# Patient Record
Sex: Male | Born: 1965 | Hispanic: No | Smoking: Never smoker
Health system: Southern US, Community
[De-identification: ages and names within clinical notes are randomized; demographics above are authoritative.]

## PROBLEM LIST (undated history)

## (undated) DIAGNOSIS — I10 Essential (primary) hypertension: Secondary | ICD-10-CM

## (undated) DIAGNOSIS — I639 Cerebral infarction, unspecified: Secondary | ICD-10-CM

## (undated) HISTORY — DX: Essential (primary) hypertension: I10

---

## 2006-05-27 ENCOUNTER — Inpatient Hospital Stay (HOSPITAL_COMMUNITY): Admission: EM | Admit: 2006-05-27 | Discharge: 2006-05-29 | Payer: Self-pay | Admitting: Emergency Medicine

## 2010-05-16 NOTE — Op Note (Signed)
NAME:  [name], [place] NO.:  [id number]   MEDICAL RECORD NO.:  [id number]          PATIENT TYPE:  INP   LOCATION:  2550                         FACILITY:  [facility]   PHYSICIAN:  Dionne Ano. Gramig III, M.D.DATE OF BIRTH:  [birth year]   DATE OF PROCEDURE:  05/27/2006  DATE OF DISCHARGE:                               OPERATIVE REPORT   PREOPERATIVE DIAGNOSES:  1. Chain saw injury to the left hand with multiple tendon injuries.  2. Comminuted segmental index metacarpal fracture.  3. Soft tissue disarray.   POSTOPERATIVE DIAGNOSES:  1. Chain saw injury to the left hand with multiple tendon injuries.  2. Comminuted segmental index metacarpal fracture.  3. Soft tissue disarray.   PROCEDURES:  1. I&D of skin, subcutaneous tissue, bone, tendon and muscle tissue      which was an excisional debridement of an open fracture, left hand.  2. Open reduction and internal fixation with extended plate and screw      construct, left index finger, metacarpal fracture.  3. Extensor pollicis longus tendon repair, left hand.  4. Extensor pollicis brevis tendon repair, left hand.  5. Superficial radial nerve burying procedure, left hand.  6. Extensor indicis proprius repair, left hand.  7. Extensor digitorum communis tendon repair to the left index finger.  8. Extensor digitorum communis repair to the middle finger.  9. First dorsal interosseous musculotendinous junction reattachment      and repair.  10.Stress radiography.   SURGEON:  Dominica Severin, M.D.   ASSISTANT:  None.   COMPLICATIONS:  None.   ANESTHESIA:  General.   INDICATIONS FOR PROCEDURE:  This patient is a 45 year old male who  presents with the above-mentioned diagnoses.  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  I was called  to see him in the emergency room, and immediately prepped him for  surgery.  This patient has significant soft tissue disarray and  significant  abnormality in regard to his index finger metacarpal  fracture which has a crushing injury with segmental comminution.  The  extensor tendons were also lacerated, and the deep muscular regions are  highly involved in the injury process.   I have counseled him in regard to the risks and benefits of the surgery  including risk of infection, bleeding, anesthesia, damage to normal  structures, and failure of surgery to accomplish its intended goals of  relieving symptoms and restoring function.  With this in mind, he  desires to proceed.  All questions had been encouraged and answered  preoperatively.   OPERATIVE FINDINGS:  This patient has significant soft tissue disarray,  opened segmental index finger metacarpal fracture which was stabilized  nicely and multiple tendon repairs as noted.  There were no complicating  features with this surgery.  I resected any nonviable tissue and  performed an aggressive debridement.   DESCRIPTION OF PROCEDURE:  The patient was seen by me and taken to the  operative suite, and underwent smooth induction of general anesthesia.  He was marked and consented, and all questions were encouraged and  answered preoperatively.  Once in the operative suite, he underwent a  thorough prep and drape with Betadine scrub and paint, and the operation  commenced with elevation of the tourniquet given his significant  bleeding.   I immediately performed cauterization and tying off venous bleeders.  Following this, I performed a wide excisional debridement of skin,  subcutaneous tissue, muscle and nonviable tissue.  Greater than 3-4  liters of saline were placed aggressively through the wound completing  the I&D.   Following this, I then performed segmental repair of the index finger  metacarpal fracture.  A 2.4 mm Synthes modular hand plate and multiple  interfrag screws were placed.  I was able to achieve good rotational  alignment, and restore the length nicely.   This was very comminuted I  should note.   Following this, I then performed stress radiography, and permanently  documented the ORIF which was judged by myself intraoperatively and  looked excellent.   Following this, I then prepared and repaired the extensor pollicis  brevis tendon at the hand level with a four strand FiberWire suture.   Following this, I repaired the extensor pollicis longus at the hand  level with a four strand FiberWire construct.  This was a 3-0 FiberWire.   The superficial radial nerve had segmental injury, and I thus buried it.  I did not see any meaningful repair efforts in this region.   Following this, I then performed extensor indicis proprius tendon repair  with a four strand FiberWire suture of the 3-0 variety.  The tendon ends  were freshened and sutured together nicely.   Following this, I then performed extensor indicis communis to the index  finger tendon repair with four strand 3-0 FiberWire.  The patient  tolerated this well.   Following this, I then performed extensor digitorum communis tendon  repair to the middle finger with a two strand FiberWire construct of the  3-0 variety.  Following this, I then irrigated copiously once again.  I  should note that the tendon repairs were performed with the tourniquet  deflated, and tourniquet time was approximately an hour.  Hemostasis was  obtained nicely.   Following this, I repaired the first dorsal interosseous  musculotendinous juncture to try to give the patient back some  restoration of his index finger interosseous function about the first  web space.   Following this, I then performed a slide advancement of skin edges.  Once this was done, 4-0 Prolene was used to secure the skin edges with a  combination far near and near far and horizontal mattress suture to  evert the skin.  The patient tolerated this well, and had excellent refill and soft compartments, and was placed in a thumb spica  splint  extended in a nature to the thumb tip of course and to the DIP joints of  the index and small fingers.   The patient tolerated the procedure well.  He was taken to the recovery  in stable condition to be continued on IV antibiotics, general postop  observation and other measures.   I discussed the pertinent do's and don'ts, etc., and all questions have  been encouraged and answered.           ______________________________  Dionne Ano. Everlene Other, M.D.     Nash Mantis  D:  05/27/2006  T:  05/28/2006  Job:  [id number]

## 2016-09-24 ENCOUNTER — Emergency Department (HOSPITAL_COMMUNITY): Payer: BLUE CROSS/BLUE SHIELD

## 2016-09-24 ENCOUNTER — Encounter (HOSPITAL_COMMUNITY): Payer: Self-pay

## 2016-09-24 ENCOUNTER — Inpatient Hospital Stay (HOSPITAL_COMMUNITY)
Admission: EM | Admit: 2016-09-24 | Discharge: 2016-10-07 | DRG: 023 | Disposition: A | Discharge status: Hospice | Payer: BLUE CROSS/BLUE SHIELD | Attending: Neurology | Admitting: Neurology

## 2016-09-24 DIAGNOSIS — R4701 Aphasia: Secondary | ICD-10-CM | POA: Diagnosis present

## 2016-09-24 DIAGNOSIS — G919 Hydrocephalus, unspecified: Secondary | ICD-10-CM

## 2016-09-24 DIAGNOSIS — I959 Hypotension, unspecified: Secondary | ICD-10-CM | POA: Diagnosis not present

## 2016-09-24 DIAGNOSIS — G911 Obstructive hydrocephalus: Secondary | ICD-10-CM | POA: Diagnosis present

## 2016-09-24 DIAGNOSIS — Z6832 Body mass index (BMI) 32.0-32.9, adult: Secondary | ICD-10-CM | POA: Diagnosis not present

## 2016-09-24 DIAGNOSIS — G935 Compression of brain: Secondary | ICD-10-CM

## 2016-09-24 DIAGNOSIS — R29810 Facial weakness: Secondary | ICD-10-CM | POA: Diagnosis present

## 2016-09-24 DIAGNOSIS — J189 Pneumonia, unspecified organism: Secondary | ICD-10-CM

## 2016-09-24 DIAGNOSIS — F1010 Alcohol abuse, uncomplicated: Secondary | ICD-10-CM | POA: Diagnosis not present

## 2016-09-24 DIAGNOSIS — E669 Obesity, unspecified: Secondary | ICD-10-CM | POA: Diagnosis present

## 2016-09-24 DIAGNOSIS — Z515 Encounter for palliative care: Secondary | ICD-10-CM | POA: Diagnosis not present

## 2016-09-24 DIAGNOSIS — R40243 Glasgow coma scale score 3-8, unspecified time: Secondary | ICD-10-CM | POA: Diagnosis present

## 2016-09-24 DIAGNOSIS — Z713 Dietary counseling and surveillance: Secondary | ICD-10-CM | POA: Diagnosis not present

## 2016-09-24 DIAGNOSIS — E876 Hypokalemia: Secondary | ICD-10-CM

## 2016-09-24 DIAGNOSIS — R069 Unspecified abnormalities of breathing: Secondary | ICD-10-CM

## 2016-09-24 DIAGNOSIS — Z452 Encounter for adjustment and management of vascular access device: Secondary | ICD-10-CM | POA: Diagnosis not present

## 2016-09-24 DIAGNOSIS — G8191 Hemiplegia, unspecified affecting right dominant side: Secondary | ICD-10-CM | POA: Diagnosis present

## 2016-09-24 DIAGNOSIS — I611 Nontraumatic intracerebral hemorrhage in hemisphere, cortical: Secondary | ICD-10-CM | POA: Diagnosis not present

## 2016-09-24 DIAGNOSIS — R414 Neurologic neglect syndrome: Secondary | ICD-10-CM | POA: Diagnosis present

## 2016-09-24 DIAGNOSIS — R739 Hyperglycemia, unspecified: Secondary | ICD-10-CM | POA: Diagnosis present

## 2016-09-24 DIAGNOSIS — I615 Nontraumatic intracerebral hemorrhage, intraventricular: Secondary | ICD-10-CM | POA: Diagnosis present

## 2016-09-24 DIAGNOSIS — I5030 Unspecified diastolic (congestive) heart failure: Secondary | ICD-10-CM | POA: Diagnosis not present

## 2016-09-24 DIAGNOSIS — E785 Hyperlipidemia, unspecified: Secondary | ICD-10-CM | POA: Diagnosis not present

## 2016-09-24 DIAGNOSIS — Z66 Do not resuscitate: Secondary | ICD-10-CM | POA: Diagnosis not present

## 2016-09-24 DIAGNOSIS — K5901 Slow transit constipation: Secondary | ICD-10-CM

## 2016-09-24 DIAGNOSIS — R4182 Altered mental status, unspecified: Secondary | ICD-10-CM | POA: Diagnosis not present

## 2016-09-24 DIAGNOSIS — Z4659 Encounter for fitting and adjustment of other gastrointestinal appliance and device: Secondary | ICD-10-CM

## 2016-09-24 DIAGNOSIS — I619 Nontraumatic intracerebral hemorrhage, unspecified: Secondary | ICD-10-CM | POA: Diagnosis present

## 2016-09-24 DIAGNOSIS — I610 Nontraumatic intracerebral hemorrhage in hemisphere, subcortical: Secondary | ICD-10-CM | POA: Diagnosis not present

## 2016-09-24 DIAGNOSIS — I10 Essential (primary) hypertension: Secondary | ICD-10-CM

## 2016-09-24 DIAGNOSIS — D72829 Elevated white blood cell count, unspecified: Secondary | ICD-10-CM | POA: Diagnosis present

## 2016-09-24 DIAGNOSIS — R1310 Dysphagia, unspecified: Secondary | ICD-10-CM | POA: Diagnosis present

## 2016-09-24 DIAGNOSIS — R29724 NIHSS score 24: Secondary | ICD-10-CM | POA: Diagnosis present

## 2016-09-24 DIAGNOSIS — G936 Cerebral edema: Secondary | ICD-10-CM

## 2016-09-24 DIAGNOSIS — I161 Hypertensive emergency: Secondary | ICD-10-CM | POA: Diagnosis not present

## 2016-09-24 DIAGNOSIS — R1312 Dysphagia, oropharyngeal phase: Secondary | ICD-10-CM | POA: Diagnosis not present

## 2016-09-24 DIAGNOSIS — I612 Nontraumatic intracerebral hemorrhage in hemisphere, unspecified: Secondary | ICD-10-CM | POA: Diagnosis not present

## 2016-09-24 HISTORY — DX: Cerebral infarction, unspecified: I63.9

## 2016-09-24 LAB — CBC
HCT: 46.7 % (ref 39.0–52.0)
Hemoglobin: 16.3 g/dL (ref 13.0–17.0)
MCH: 33.1 pg (ref 26.0–34.0)
MCHC: 34.9 g/dL (ref 30.0–36.0)
MCV: 94.7 fL (ref 78.0–100.0)
PLATELETS: 187 10*3/uL (ref 150–400)
RBC: 4.93 MIL/uL (ref 4.22–5.81)
RDW: 12.2 % (ref 11.5–15.5)
WBC: 9.8 10*3/uL (ref 4.0–10.5)

## 2016-09-24 LAB — COMPREHENSIVE METABOLIC PANEL WITH GFR
ALT: 111 U/L — ABNORMAL HIGH (ref 17–63)
AST: 53 U/L — ABNORMAL HIGH (ref 15–41)
Albumin: 3.8 g/dL (ref 3.5–5.0)
Alkaline Phosphatase: 77 U/L (ref 38–126)
Anion gap: 9 (ref 5–15)
BUN: 10 mg/dL (ref 6–20)
CO2: 26 mmol/L (ref 22–32)
Calcium: 8.8 mg/dL — ABNORMAL LOW (ref 8.9–10.3)
Chloride: 101 mmol/L (ref 101–111)
Creatinine, Ser: 0.88 mg/dL (ref 0.61–1.24)
GFR calc Af Amer: >60 mL/min
GFR calc non Af Amer: >60 mL/min
Glucose, Bld: 124 mg/dL — ABNORMAL HIGH (ref 65–99)
Potassium: 3.4 mmol/L — ABNORMAL LOW (ref 3.5–5.1)
Sodium: 136 mmol/L (ref 135–145)
Total Bilirubin: 1.2 mg/dL (ref 0.3–1.2)
Total Protein: 6.5 g/dL (ref 6.5–8.1)

## 2016-09-24 LAB — DIFFERENTIAL
Basophils Absolute: 0 K/uL (ref 0.0–0.1)
Basophils Relative: 0 %
Eosinophils Absolute: 0.2 K/uL (ref 0.0–0.7)
Eosinophils Relative: 2 %
Lymphocytes Relative: 33 %
Lymphs Abs: 3.2 K/uL (ref 0.7–4.0)
Monocytes Absolute: 0.9 K/uL (ref 0.1–1.0)
Monocytes Relative: 9 %
Neutro Abs: 5.5 K/uL (ref 1.7–7.7)
Neutrophils Relative %: 56 %

## 2016-09-24 LAB — URINALYSIS, ROUTINE W REFLEX MICROSCOPIC
Bacteria, UA: NONE SEEN
Bilirubin Urine: NEGATIVE
Glucose, UA: 150 mg/dL — AB
Ketones, ur: NEGATIVE mg/dL
Leukocytes, UA: NEGATIVE
Nitrite: NEGATIVE
Protein, ur: NEGATIVE mg/dL
RBC / HPF: NONE SEEN RBC/hpf (ref 0–5)
Specific Gravity, Urine: 1.026 (ref 1.005–1.030)
Squamous Epithelial / HPF: NONE SEEN
pH: 6 (ref 5.0–8.0)

## 2016-09-24 LAB — I-STAT CHEM 8, ED
BUN: 11 mg/dL (ref 6–20)
CREATININE: 0.9 mg/dL (ref 0.61–1.24)
Calcium, Ion: 1.03 mmol/L — ABNORMAL LOW (ref 1.15–1.40)
Chloride: 98 mmol/L — ABNORMAL LOW (ref 101–111)
Glucose, Bld: 124 mg/dL — ABNORMAL HIGH (ref 65–99)
HEMATOCRIT: 49 % (ref 39.0–52.0)
HEMOGLOBIN: 16.7 g/dL (ref 13.0–17.0)
POTASSIUM: 3.4 mmol/L — AB (ref 3.5–5.1)
Sodium: 137 mmol/L (ref 135–145)
TCO2: 26 mmol/L (ref 22–32)

## 2016-09-24 LAB — PROTIME-INR
INR: 1.09
Prothrombin Time: 14 seconds (ref 11.4–15.2)

## 2016-09-24 LAB — ETHANOL: ALCOHOL ETHYL (B): 8 mg/dL — AB (ref <5)

## 2016-09-24 LAB — I-STAT TROPONIN, ED: Troponin i, poc: 0.01 ng/mL (ref 0.00–0.08)

## 2016-09-24 LAB — RAPID URINE DRUG SCREEN, HOSP PERFORMED
Amphetamines: NOT DETECTED
BENZODIAZEPINES: NOT DETECTED
Barbiturates: NOT DETECTED
COCAINE: NOT DETECTED
Opiates: NOT DETECTED
Tetrahydrocannabinol: NOT DETECTED

## 2016-09-24 LAB — APTT: aPTT: 27 s (ref 24–36)

## 2016-09-24 MED ORDER — ACETAMINOPHEN 650 MG RE SUPP
650.0000 mg | RECTAL | Status: DC | PRN
  Administered 2016-10-02 (×2): 650 mg via RECTAL
  Filled 2016-09-24 (×2): qty 1

## 2016-09-24 MED ORDER — SENNOSIDES-DOCUSATE SODIUM 8.6-50 MG PO TABS
1.0000 | ORAL_TABLET | Freq: Two times a day (BID) | ORAL | Status: DC
  Administered 2016-09-28 – 2016-10-06 (×14): 1 via ORAL
  Filled 2016-09-24 (×16): qty 1

## 2016-09-24 MED ORDER — LABETALOL HCL 5 MG/ML IV SOLN
20.0000 mg | Freq: Once | INTRAVENOUS | Status: AC
  Administered 2016-09-24: 20 mg via INTRAVENOUS

## 2016-09-24 MED ORDER — IOPAMIDOL (ISOVUE-370) INJECTION 76%
INTRAVENOUS | Status: AC
  Filled 2016-09-24: qty 50

## 2016-09-24 MED ORDER — NICARDIPINE HCL IN NACL 20-0.86 MG/200ML-% IV SOLN
3.0000 mg/h | INTRAVENOUS | Status: DC
  Administered 2016-09-25: 5 mg/h via INTRAVENOUS
  Filled 2016-09-24: qty 200

## 2016-09-24 MED ORDER — LABETALOL HCL 5 MG/ML IV SOLN: 20.0000 mg | Freq: Once | INTRAVENOUS | Status: DC

## 2016-09-24 MED ORDER — NICARDIPINE HCL IN NACL 20-0.86 MG/200ML-% IV SOLN
INTRAVENOUS | Status: AC
  Administered 2016-09-24: 5 mg
  Filled 2016-09-24: qty 200

## 2016-09-24 MED ORDER — ACETAMINOPHEN 325 MG PO TABS: 650.0000 mg | ORAL_TABLET | ORAL | Status: DC | PRN

## 2016-09-24 MED ORDER — ACETAMINOPHEN 160 MG/5ML PO SOLN
650.0000 mg | ORAL | Status: DC | PRN
  Administered 2016-10-03 – 2016-10-06 (×8): 650 mg
  Filled 2016-09-24 (×8): qty 20.3

## 2016-09-24 MED ORDER — PANTOPRAZOLE SODIUM 40 MG IV SOLR
40.0000 mg | Freq: Every day | INTRAVENOUS | Status: DC
  Administered 2016-09-24: 40 mg via INTRAVENOUS
  Filled 2016-09-24: qty 40

## 2016-09-24 MED ORDER — STROKE: EARLY STAGES OF RECOVERY BOOK
Freq: Once | Status: AC
  Administered 2016-09-25: 01:00:00
  Filled 2016-09-24: qty 1

## 2016-09-24 NOTE — ED Notes (Signed)
Patient arrived to ED via EMS. Family reported that they heard a loud thump around 19:30 when patient went to bathroom. Patient was found unresponsive on floor. Right side arm and leg weakness, with right facial droop present. Pt speech minimal and slurred. Blood drawn. Neurologist at bedside. Patient being taken to CT 1.

## 2016-09-24 NOTE — ED Notes (Signed)
Family at bedside. 

## 2016-09-24 NOTE — ED Notes (Signed)
ED Provider at bedside. 

## 2016-09-24 NOTE — Code Documentation (Signed)
Responded to Code stroke called at 2003.  Pt arrived to ED at 2026.  Pt was with girlfriend at home.  At 1930, g/f heard a "thud" and found pt with inability to move R arm/leg, and slurred speech. BP per [id number].  Upon arrival pt's NIH-24. Pt with R sided deficits, severe aphasic, and L sided gaze. CT head-hemorrhage.  Plan to admit to ICU.

## 2016-09-24 NOTE — ED Notes (Signed)
[place], MD at bedside. 

## 2016-09-24 NOTE — ED Provider Notes (Signed)
[facility] Provider Note   CSN: [id number] Arrival date & time: 09/24/16  2026     History   Chief Complaint Chief Complaint  Patient presents with  . Code Stroke    HPI [name] is a 51 y.o. male.  HPI Level 5 caveat. Patient non-verbal. Per family, he was mowing the lawn, and about 7PM found by wife. He was unable to speak, and unable to move the right side. EMS was called and on their arrival code stroke was activated.Patient nonverbal on presentation, unable to provide history.   No past medical history on file.  Patient Active Problem List   Diagnosis Date Noted  . ICH (intracerebral hemorrhage) (HCC) 09/24/2016    No past surgical history on file.     Home Medications    Prior to Admission medications   Not on File    Family History No family history on file.  Social History Social History  Substance Use Topics  . Smoking status: Not on file  . Smokeless tobacco: Not on file  . Alcohol use Not on file     Allergies   Patient has no known allergies.   Review of Systems Review of Systems  Unable to perform ROS: Patient nonverbal     Physical Exam Updated Vital Signs BP (!) 147/82   Pulse 75   Temp 98.4 F (36.9 C) (Oral)   Resp (!) 26   Wt 95.6 kg (210 lb 12.2 oz)   SpO2 97%   Physical Exam Physical Exam  Constitutional: Mildly agitated and restless. Occasional groaning. Non-verbal. HENT:  Head: Normocephalic. Atraumatic Eyes: Conjunctivae are normal. Pupils 2 mm bilateral, symmetric.  Cardiovascular: Normal rate and intact distal pulses.   Neck: supple Pulmonary/Chest: Effort normal. No respiratory distress.  Abdominal: Exhibits no distension. No tenderness to palpation.  Musculoskeletal: Normal range of motion. Exhibits no deformity.  Neurological: Alert. Non-verbal. Slight resting right facial droop. Moves left upper and lower extremities spontaneously. Upward babinski. Flaccid in right upper and lower  extremities.  Skin: Skin is warm and dry.  Nursing note and vitals reviewed.   ED Treatments / Results  Labs (all labs ordered are listed, but only abnormal results are displayed) Labs Reviewed  ETHANOL - Abnormal; Notable for the following:       Result Value   Alcohol, Ethyl (B) 8 (*)    All other components within normal limits  COMPREHENSIVE METABOLIC PANEL - Abnormal; Notable for the following:    Potassium 3.4 (*)    Glucose, Bld 124 (*)    Calcium 8.8 (*)    AST 53 (*)    ALT 111 (*)    All other components within normal limits  I-STAT CHEM 8, ED - Abnormal; Notable for the following:    Potassium 3.4 (*)    Chloride 98 (*)    Glucose, Bld 124 (*)    Calcium, Ion 1.03 (*)    All other components within normal limits  PROTIME-INR  APTT  CBC  DIFFERENTIAL  RAPID URINE DRUG SCREEN, HOSP PERFORMED  URINALYSIS, ROUTINE W REFLEX MICROSCOPIC  HIV ANTIBODY (ROUTINE TESTING)  RAPID URINE DRUG SCREEN, HOSP PERFORMED  ETHANOL  I-STAT TROPONIN, ED    EKG  EKG Interpretation None       Radiology Ct Angio Head W Or Wo Contrast  Addendum Date: 09/24/2016   ADDENDUM REPORT: 09/24/2016 21:27 CONTRAST:  50 cc Isovue 370 Electronically Signed   By: Awilda Metro M.D.   On: 09/24/2016  21:27   Result Date: 09/24/2016 CLINICAL DATA:  Code stroke. LEFT-sided weakness and slurred speech. EXAM: CT ANGIOGRAPHY HEAD TECHNIQUE: Multidetector CT imaging of the head was performed using the standard protocol during bolus administration of intravenous contrast. Multiplanar CT image reconstructions and MIPs were obtained to evaluate the vascular anatomy. COMPARISON:  None. FINDINGS: CT HEAD BRAIN: 3.6 x 5.9 x 4.6 cm (volume = 51 cm^3) dense LEFT thalamus, basal ganglia and external capsule hematoma. 3 mm LEFT-to-RIGHT midline shift. No ventricular entrapment or hydrocephalus. No acute large vascular territory infarct. Old RIGHT basal ganglia lacunar infarct. No abnormal extra-axial fluid  collections. Basal cisterns are patent. VASCULAR: Moderate calcific atherosclerosis carotid siphons. SKULL/SOFT TISSUES: No skull fracture. No significant soft tissue swelling. ORBITS/SINUSES: The included ocular globes and orbital contents are normal.Small LEFT maxillary mucosal retention cyst. Mastoid air cells are well aerated. OTHER: None. CTA HEAD ANTERIOR CIRCULATION: Patent cervical internal carotid arteries, petrous, cavernous and supra clinoid internal carotid arteries. Mild stenosis LEFT cavernous to supraclinoid segment due to calcific atherosclerosis. Patent anterior communicating artery. Moderate tandem stenoses bilateral anterior and middle cerebral artery's. Angiographic spot sign central within the LEFT hematoma. POSTERIOR CIRCULATION: Codominant vertebral arteries, vertebrobasilar junction and basilar artery, as well as main branch vessels. Patent posterior cerebral arteries. RIGHT and possibly LEFT posterior communicating artery is present. Severe stenosis LEFT distal P2 segment. No large vessel occlusion, significant stenosis, contrast extravasation or aneurysm. VENOUS SINUSES: Major dural venous sinuses are patent though not tailored for evaluation on this angiographic examination. ANATOMIC VARIANTS: None. DELAYED PHASE: Not performed. MIP images reviewed. IMPRESSION: CT HEAD: 1. Large LEFT thalamus and surrounding structure intraparenchymal hematoma. 3 mm LEFT-to-RIGHT midline shift without ventricular entrapment or hydrocephalus. 2. Old RIGHT basal ganglia lacunar infarct. CTA HEAD: 1. Angiographic spot sign central within LEFT hematoma consistent with active hemorrhage. 2. No emergent large vessel occlusion. 3. Severe stenosis LEFT P2 segment most compatible with atherosclerosis. Moderate stenosis anterior circulation, likely due to atherosclerosis though nonspecific in the presence of hemorrhage. Critical Value/emergent results were called by telephone at the time of interpretation on  09/24/2016 at 9:00 pm to Dr. Crista Curb ; Arther Dames , who verbally acknowledged these results. Electronically Signed: By: Awilda Metro M.D. On: 09/24/2016 21:11   Ct Head Code Stroke Wo Contrast  Addendum Date: 09/24/2016   ADDENDUM REPORT: 09/24/2016 21:27 CONTRAST:  50 cc Isovue 370 Electronically Signed   By: Awilda Metro M.D.   On: 09/24/2016 21:27   Result Date: 09/24/2016 CLINICAL DATA:  Code stroke. LEFT-sided weakness and slurred speech. EXAM: CT ANGIOGRAPHY HEAD TECHNIQUE: Multidetector CT imaging of the head was performed using the standard protocol during bolus administration of intravenous contrast. Multiplanar CT image reconstructions and MIPs were obtained to evaluate the vascular anatomy. COMPARISON:  None. FINDINGS: CT HEAD BRAIN: 3.6 x 5.9 x 4.6 cm (volume = 51 cm^3) dense LEFT thalamus, basal ganglia and external capsule hematoma. 3 mm LEFT-to-RIGHT midline shift. No ventricular entrapment or hydrocephalus. No acute large vascular territory infarct. Old RIGHT basal ganglia lacunar infarct. No abnormal extra-axial fluid collections. Basal cisterns are patent. VASCULAR: Moderate calcific atherosclerosis carotid siphons. SKULL/SOFT TISSUES: No skull fracture. No significant soft tissue swelling. ORBITS/SINUSES: The included ocular globes and orbital contents are normal.Small LEFT maxillary mucosal retention cyst. Mastoid air cells are well aerated. OTHER: None. CTA HEAD ANTERIOR CIRCULATION: Patent cervical internal carotid arteries, petrous, cavernous and supra clinoid internal carotid arteries. Mild stenosis LEFT cavernous to supraclinoid segment due to calcific atherosclerosis. Patent anterior  communicating artery. Moderate tandem stenoses bilateral anterior and middle cerebral artery's. Angiographic spot sign central within the LEFT hematoma. POSTERIOR CIRCULATION: Codominant vertebral arteries, vertebrobasilar junction and basilar artery, as well as main branch vessels. Patent  posterior cerebral arteries. RIGHT and possibly LEFT posterior communicating artery is present. Severe stenosis LEFT distal P2 segment. No large vessel occlusion, significant stenosis, contrast extravasation or aneurysm. VENOUS SINUSES: Major dural venous sinuses are patent though not tailored for evaluation on this angiographic examination. ANATOMIC VARIANTS: None. DELAYED PHASE: Not performed. MIP images reviewed. IMPRESSION: CT HEAD: 1. Large LEFT thalamus and surrounding structure intraparenchymal hematoma. 3 mm LEFT-to-RIGHT midline shift without ventricular entrapment or hydrocephalus. 2. Old RIGHT basal ganglia lacunar infarct. CTA HEAD: 1. Angiographic spot sign central within LEFT hematoma consistent with active hemorrhage. 2. No emergent large vessel occlusion. 3. Severe stenosis LEFT P2 segment most compatible with atherosclerosis. Moderate stenosis anterior circulation, likely due to atherosclerosis though nonspecific in the presence of hemorrhage. Critical Value/emergent results were called by telephone at the time of interpretation on 09/24/2016 at 9:00 pm to Dr. Crista Curb ; Arther Dames , who verbally acknowledged these results. Electronically Signed: By: Awilda Metro M.D. On: 09/24/2016 21:11    Procedures Procedures (including critical care time) CRITICAL CARE Performed by: Lavera Guise   Total critical care time: 31 minutes  Critical care time was exclusive of separately billable procedures and treating other patients.  Critical care was necessary to treat or prevent imminent or life-threatening deterioration.  Critical care was time spent personally by me on the following activities: development of treatment plan with patient and/or surrogate as well as nursing, discussions with consultants, evaluation of patient's response to treatment, examination of patient, obtaining history from patient or surrogate, ordering and performing treatments and interventions, ordering and review  of laboratory studies, ordering and review of radiographic studies, pulse oximetry and re-evaluation of patient's condition.  Medications Ordered in ED Medications  iopamidol (ISOVUE-370) 76 % injection (not administered)   stroke: mapping our early stages of recovery book (not administered)  acetaminophen (TYLENOL) tablet 650 mg (not administered)    Or  acetaminophen (TYLENOL) solution 650 mg (not administered)    Or  acetaminophen (TYLENOL) suppository 650 mg (not administered)  senna-docusate (Senokot-S) tablet 1 tablet (not administered)  pantoprazole (PROTONIX) injection 40 mg (40 mg Intravenous Given 09/24/16 2131)  nicardipine (CARDENE)  in 0.86% saline IV infusion (0.1 mg/ml) (3 mg/hr Intravenous Not Given 09/24/16 2141)  niCARdipine in saline (CARDENE-IV) 20-0.86 MG/200ML-% infusion SOLN (3 mg/hr  Rate/Dose Change 09/24/16 2134)  labetalol (NORMODYNE,TRANDATE) injection 20 mg (20 mg Intravenous Given 09/24/16 2037)  labetalol (NORMODYNE,TRANDATE) injection 20 mg (20 mg Intravenous Given 09/24/16 2050)     Initial Impression / Assessment and Plan / ED Course  I have reviewed the triage vital signs and the nursing notes.  Pertinent labs & imaging results that were available during my care of the patient were reviewed by me and considered in my medical decision making (see chart for details).     Patient is code stroke, estimated by EMS prior to arrival. He was noted to be extremely hypertensive by EMS. On exam, is a phasic, flaccid in the right upper or lower extremities mild right facial droop.CT visualized, and also reviewed with radiology. With large left intraparenchymal stroke, or signs of active bleeding, and mild shift. Dr. Wilford Corner has been with patient since arrival. Nicardipine gtt was started while in CT. Admit to neurology service to ICU.  Final Clinical Impressions(s) / ED Diagnoses   Final diagnoses:  Hemorrhagic stroke [facility])    New Prescriptions New  Prescriptions   No medications on file     Lavera Guise, MD 09/24/16 2149

## 2016-09-24 NOTE — H&P (Addendum)
Requesting Physician: Dr. Crista Curb    Chief Complaint: Aphasia, AMS, R side weakness   History obtained from: EMS, family   HPI:                                                                                                                                       [name] is an 51 y.o. male with PMH of HTN,alcohol abuse who presents as a stroke alert for aphasia and right-sided weakness. He was last seen normal around 7:30 PM when his girlfriend/spouse heard a loud thud. Patient was found on the floor and not speaking. EMS was called immediately and found the patient to be plegic on the right side, aphasic and had a left gaze deviation Blood pressure was 230 systolic at the scene. On arrival to [facility] ER, the patient is no longer following commands. A stat CT head was obtained which demonstrated a large left thalamic hemorrhage. The patient has a history of hypertension but not on any blood pressure medications. He is not on aspirin or any blood thinners.   Date last known well: 09.24.18 Time last known well: 7.30 pm tPA Given: no, hemorrhage NIHSS 24 Modified Rankin: 0  Intracerebral Hemorrhage (ICH) Score  Glascow Coma Score  5-12 1  Age >/=no 0  ICH volume >/= 30ml  yes +1  IVH yes no 0  Infratentorial origin no 0 Total: 2  No past medical history on file.  No past surgical history on file.  No family history on file. Social History:  has no tobacco, alcohol, and drug history on file.  Allergies: Allergies not on file  Medications:                                                                                                                          No home medications per girlfriend/wife   ROS:  Unable to obtain due to patient being aphasic   Examination:                                                                                                       General: Appears well-developed and obese Psych: Affect appropriate to situation Eyes: No scleral injection HENT: No OP obstrucion Head: Normocephalic.  Cardiovascular: Normal rate and regular rhythm.  Respiratory: Effort normal and breath sounds normal to anterior ascultation GI: Soft.  No distension. There is no tenderness.  Skin: WDI   Neurological Examination Mental Status: Alert, but not oriented. Aphasic, mumbles incoherently. Does not follow commands. Cranial Nerves:  Visual fields : Right homonymous hemianopsia, eyes deviated towards left side   right facial droop, midline tongue extension Motor: Right : Upper extremity   0/5    Left:     Upper extremity   5/5  Lower extremity   1/5     Lower extremity   5/5 Tone and bulk:normal tone throughout; no atrophy noted Sensory: reduced withdrawal on right side when compared to right Deep Tendon Reflexes: 2+ and symmetric throughout Plantars: Right: downgoing   Left: downgoing Cerebellar: Unable to assess, no obvious ataxia on left side Gait: unable to walk due to hemiplegia     Lab Results: Basic Metabolic Panel:  Recent Labs Lab 09/24/16 2033  NA 137  K 3.4*  CL 98*  GLUCOSE 124*  BUN 11  CREATININE 0.90    CBC:  Recent Labs Lab 09/24/16 2029 09/24/16 2033  WBC 9.8  --   NEUTROABS 5.5  --   HGB 16.3 16.7  HCT 46.7 49.0  MCV 94.7  --   PLT 187  --     Coagulation Studies:  Recent Labs  09/24/16 2029  LABPROT 14.0  INR 1.09    Imaging: No results found.   ASSESSMENT AND PLAN  51 year old male with past medical history of hypertension,alcohol abuse not on medication presents aphasia and right hemiplegia due to  left thalamic hemorrhage with midline shift and vasogenic edema, likely related to hypertension. CT angiogram did not reveal any obvious AV malformations, aneurysms. Spot sign was present indicating active hemorrhage. Coag workup was negative.  Left  thalamic hemorrhage with vasogenic edema and midline shift, positive spot sign  Hypertensive Emergency Aphasia Right Hemiplegia  Plan Admit to Neuro ICU Repeat Ct x 4 hrs No antiplatelets, anticoagulants BP goal <140 SBP PT/OT/Speech eval Neurochecks q4h  Cerebral Edema with Midline shift  No evidence for hypertonic saline in management of vasogenic edema Will avoid hyponatremia  Close ICU monitoring Neurosurgery consulted: felt patient was not a candidate for decompression/hematoma evacuation  HTN Emergency Started Nicardipine drip Goal SBP <140 Will transition to oral medications over next 24 hrs   Alcohol abuse ICU CIWA protocol    DVT PPX; SCD Diet: NPO   Lorana Maffeo [facility] Pager Number [phone]

## 2016-09-25 ENCOUNTER — Inpatient Hospital Stay (HOSPITAL_COMMUNITY): Payer: BLUE CROSS/BLUE SHIELD

## 2016-09-25 DIAGNOSIS — I161 Hypertensive emergency: Secondary | ICD-10-CM

## 2016-09-25 DIAGNOSIS — I612 Nontraumatic intracerebral hemorrhage in hemisphere, unspecified: Secondary | ICD-10-CM

## 2016-09-25 DIAGNOSIS — F1010 Alcohol abuse, uncomplicated: Secondary | ICD-10-CM

## 2016-09-25 DIAGNOSIS — I5030 Unspecified diastolic (congestive) heart failure: Secondary | ICD-10-CM

## 2016-09-25 DIAGNOSIS — G936 Cerebral edema: Secondary | ICD-10-CM

## 2016-09-25 DIAGNOSIS — Z452 Encounter for adjustment and management of vascular access device: Secondary | ICD-10-CM

## 2016-09-25 DIAGNOSIS — I619 Nontraumatic intracerebral hemorrhage, unspecified: Secondary | ICD-10-CM

## 2016-09-25 LAB — BASIC METABOLIC PANEL
Anion gap: 8 (ref 5–15)
BUN: 11 mg/dL (ref 6–20)
CHLORIDE: 102 mmol/L (ref 101–111)
CO2: 25 mmol/L (ref 22–32)
Calcium: 8.6 mg/dL — ABNORMAL LOW (ref 8.9–10.3)
Creatinine, Ser: 0.92 mg/dL (ref 0.61–1.24)
GFR calc Af Amer: >60 mL/min (ref >60)
GFR calc non Af Amer: >60 mL/min (ref >60)
GLUCOSE: 160 mg/dL — AB (ref 65–99)
POTASSIUM: 3.7 mmol/L (ref 3.5–5.1)
Sodium: 135 mmol/L (ref 135–145)

## 2016-09-25 LAB — ECHOCARDIOGRAM COMPLETE
Height: 67 in
WEIGHTICAEL: 3322.77 [oz_av]

## 2016-09-25 LAB — CBC
HEMATOCRIT: 45.1 % (ref 39.0–52.0)
HEMOGLOBIN: 15.8 g/dL (ref 13.0–17.0)
MCH: 33.5 pg (ref 26.0–34.0)
MCHC: 35 g/dL (ref 30.0–36.0)
MCV: 95.8 fL (ref 78.0–100.0)
Platelets: 176 10*3/uL (ref 150–400)
RBC: 4.71 MIL/uL (ref 4.22–5.81)
RDW: 12.1 % (ref 11.5–15.5)
WBC: 11.3 10*3/uL — ABNORMAL HIGH (ref 4.0–10.5)

## 2016-09-25 LAB — HIV ANTIBODY (ROUTINE TESTING W REFLEX): HIV Screen 4th Generation wRfx: NONREACTIVE

## 2016-09-25 LAB — SODIUM: SODIUM: 139 mmol/L (ref 135–145)

## 2016-09-25 LAB — MRSA PCR SCREENING: MRSA by PCR: NEGATIVE

## 2016-09-25 LAB — ETHANOL

## 2016-09-25 MED ORDER — THIAMINE HCL 100 MG/ML IJ SOLN
100.0000 mg | Freq: Every day | INTRAMUSCULAR | Status: DC
  Administered 2016-09-25 – 2016-09-28 (×4): 100 mg via INTRAVENOUS
  Filled 2016-09-25 (×4): qty 2

## 2016-09-25 MED ORDER — ORAL CARE MOUTH RINSE
15.0000 mL | Freq: Two times a day (BID) | OROMUCOSAL | Status: DC
  Administered 2016-09-26 – 2016-10-06 (×21): 15 mL via OROMUCOSAL

## 2016-09-25 MED ORDER — PANTOPRAZOLE SODIUM 40 MG IV SOLR
40.0000 mg | Freq: Every day | INTRAVENOUS | Status: DC
  Administered 2016-09-25 – 2016-10-05 (×11): 40 mg via INTRAVENOUS
  Filled 2016-09-25 (×11): qty 40

## 2016-09-25 MED ORDER — FOLIC ACID 5 MG/ML IJ SOLN
1.0000 mg | Freq: Every day | INTRAMUSCULAR | Status: DC
  Administered 2016-09-26 – 2016-09-28 (×3): 1 mg via INTRAVENOUS
  Filled 2016-09-25 (×4): qty 0.2

## 2016-09-25 MED ORDER — SODIUM CHLORIDE 23.4 % INJECTION (4 MEQ/ML) FOR IV ADMINISTRATION
30.0000 mL | Freq: Once | INTRAVENOUS | Status: AC
  Administered 2016-09-25: 30 mL via INTRAVENOUS
  Filled 2016-09-25: qty 30

## 2016-09-25 MED ORDER — DEXMEDETOMIDINE HCL IN NACL 200 MCG/50ML IV SOLN: 0.2000 ug/kg/h | INTRAVENOUS | Status: DC

## 2016-09-25 MED ORDER — CHLORHEXIDINE GLUCONATE 0.12 % MT SOLN
15.0000 mL | Freq: Two times a day (BID) | OROMUCOSAL | Status: DC
  Administered 2016-09-25 – 2016-10-06 (×22): 15 mL via OROMUCOSAL
  Filled 2016-09-25 (×14): qty 15

## 2016-09-25 MED ORDER — SODIUM CHLORIDE 3 % IV SOLN
INTRAVENOUS | Status: DC
  Administered 2016-09-25 – 2016-09-29 (×11): 75 mL/h via INTRAVENOUS
  Filled 2016-09-25 (×24): qty 500

## 2016-09-25 MED ORDER — CLEVIDIPINE BUTYRATE 0.5 MG/ML IV EMUL
0.0000 mg/h | INTRAVENOUS | Status: DC
  Administered 2016-09-25: 15 mg/h via INTRAVENOUS
  Administered 2016-09-25: 16 mg/h via INTRAVENOUS
  Administered 2016-09-25: 14 mg/h via INTRAVENOUS
  Administered 2016-09-25: 17 mg/h via INTRAVENOUS
  Administered 2016-09-25: 15 mg/h via INTRAVENOUS
  Administered 2016-09-25: 1 mg/h via INTRAVENOUS
  Administered 2016-09-25: 10 mg/h via INTRAVENOUS
  Administered 2016-09-25: 16 mg/h via INTRAVENOUS
  Administered 2016-09-26: 18 mg/h via INTRAVENOUS
  Administered 2016-09-26 (×4): 21 mg/h via INTRAVENOUS
  Administered 2016-09-26: 16 mg/h via INTRAVENOUS
  Administered 2016-09-26: 21 mg/h via INTRAVENOUS
  Administered 2016-09-26: 17 mg/h via INTRAVENOUS
  Administered 2016-09-27: 10 mg/h via INTRAVENOUS
  Administered 2016-09-27: 8 mg/h via INTRAVENOUS
  Administered 2016-09-27: 1 mg/h via INTRAVENOUS
  Administered 2016-09-28: 6 mg/h via INTRAVENOUS
  Administered 2016-09-28: 7 mg/h via INTRAVENOUS
  Administered 2016-09-28: 6 mg/h via INTRAVENOUS
  Administered 2016-09-29: 9 mg/h via INTRAVENOUS
  Administered 2016-09-29: 8 mg/h via INTRAVENOUS
  Filled 2016-09-25 (×24): qty 50

## 2016-09-25 MED ORDER — SODIUM CHLORIDE 0.9 % IV SOLN
INTRAVENOUS | Status: DC
  Administered 2016-09-25: 10:00:00 via INTRAVENOUS

## 2016-09-25 MED ORDER — LORAZEPAM 2 MG/ML IJ SOLN
1.0000 mg | INTRAMUSCULAR | Status: DC | PRN
  Administered 2016-09-29: 1 mg via INTRAVENOUS
  Administered 2016-09-30 (×6): 2 mg via INTRAVENOUS
  Administered 2016-10-07: 1 mg via INTRAVENOUS
  Filled 2016-09-25 (×8): qty 1

## 2016-09-25 NOTE — Progress Notes (Signed)
PT Cancellation Note  Patient Details Name: [name] MRN: [id number] DOB: 01/20/1965   Cancelled Treatment:    Reason Eval/Treat Not Completed: Other (comment) pt on bedrest. MD paged to advise. PT to check back later.  Sheppard Evens, [place] #[phone] office   Doralyn Kirkes 09/25/2016, 10:43 AM

## 2016-09-25 NOTE — Evaluation (Signed)
Clinical/Bedside Swallow Evaluation Patient Details  Name: [name] MRN: [id number] Date of Birth: 22-Sep-1965  Today's Date: 09/25/2016 Time: SLP Start Time (ACUTE ONLY): 1030 SLP Stop Time (ACUTE ONLY): 1045 SLP Time Calculation (min) (ACUTE ONLY): 15 min  Past Medical History:  Past Medical History:  Diagnosis Date  . Hypertension    Past Surgical History: No past surgical history on file. 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    Assessment / Plan / Recommendation Clinical Impression  Pt presents with a neurogenic dysphagia with focal CN deficits on right, reduced oral control/bolus loss anterior right oral cavity; immediate wet voice after consumption of ice chips and explosive coughing after administration of water, concerning for aspiration.  Recommend continuing NPO for today - SLP will f/u next date for readiness for instrumental swallow study.  D/W pt's daughter and wife, from whom he is separated.  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  Type of Study: Bedside Swallow Evaluation Diet Prior to this Study: NPO Temperature Spikes Noted: No Respiratory Status: Nasal  cannula History of Recent Intubation: No Behavior/Cognition: Alert Oral Cavity Assessment: Excessive secretions Oral Care Completed by SLP: Recent completion by staff Self-Feeding Abilities: Needs assist Patient Positioning: Upright in bed Baseline Vocal Quality: Low vocal intensity Volitional Cough: Cognitively unable to elicit Volitional Swallow: Unable to elicit    Oral/Motor/Sensory Function Overall Oral Motor/Sensory Function: Moderate impairment Facial ROM: Reduced right Facial Symmetry: Abnormal symmetry right;Suspected CN VII (facial) dysfunction Facial Sensation: Reduced right;Suspected CN V (Trigeminal) dysfunction Lingual Symmetry: Suspected CN XII (hypoglossal) dysfunction;Other (Comment) (poor extension)   Ice Chips Ice chips: Impaired Presentation: Spoon Oral Phase Impairments: Reduced labial seal Oral Phase Functional Implications: Right anterior spillage Pharyngeal Phase Impairments: Wet Vocal Quality   Thin Liquid Thin Liquid: Impaired Presentation: Cup Oral Phase Impairments: Reduced labial seal Oral Phase Functional Implications: Right anterior spillage Pharyngeal  Phase Impairments: Cough - Immediate    Nectar Thick Nectar Thick Liquid: Not tested   Honey Thick Honey Thick Liquid: Not tested   Puree Puree: Not tested   Solid   GO   Solid: Not tested        [name] 09/25/2016,1:24 PM  Marchelle Folks L. Samson Frederic, [place] CCC/SLP Pager [phone]

## 2016-09-25 NOTE — Evaluation (Signed)
Speech Language Pathology Evaluation Patient Details Name: [name] MRN: [id number] DOB: 1965/02/29 Today's Date: 09/25/2016 Time: 1045-1100 SLP Time Calculation (min) (ACUTE ONLY): 15 min  Problem List:  Patient Active Problem List   Diagnosis Date Noted  . ICH (intracerebral hemorrhage) (HCC) 09/24/2016   Past Medical History:  Past Medical History:  Diagnosis Date  . Hypertension    Past Surgical History: No past surgical history on file. 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    Assessment / Plan / Recommendation Clinical Impression  Pt presents with a subcortical aphasia with dysfluent, unintelligible speech; mod-severe deficits in comprehension as measured by impaired yes/no accuracy, simple command-following.  Expression is marked by some stimulability for single word productions after repetition; able to recite DOW, count to ten when performed in unison with clinician, but not independently.  Presents with both motor and verbal perseverations.  Discussed with daughter, wife (they are separated) the nature of [name] aphasia, ways to facilitate his understanding, anticipated progress (which will be dependent on condition/recovery over next few days). They asked questions, verbalized understanding.  SLP will follow for aphasia tx to address basic communication.     SLP Assessment  SLP Recommendation/Assessment: Patient needs continued Speech Lanaguage Pathology Services SLP Visit Diagnosis: Aphasia (R47.01)    Follow Up Recommendations   (tba)    Frequency and Duration min 3x week  2 weeks      SLP Evaluation Cognition  Overall Cognitive Status: Impaired/Different from baseline Arousal/Alertness: Awake/alert Orientation Level: Other (comment) (aphasic) Attention:  Focused       Comprehension  Auditory Comprehension Overall Auditory Comprehension: Impaired Yes/No Questions: Impaired Basic Biographical Questions: 26-50% accurate Commands: Impaired One Step Basic Commands: 0-24% accurate Conversation: Simple EffectiveTechniques: Extra processing time;Pausing;Repetition;Slowed speech Visual Recognition/Discrimination Discrimination: Not tested Reading Comprehension Reading Status: Not tested    Expression Expression Primary Mode of Expression: Verbal Verbal Expression Overall Verbal Expression: Impaired Automatic Speech: Counting;Day of week (with modeling, tactile, and visual cues) Level of Generative/Spontaneous Verbalization: Phrase Repetition: Impaired Level of Impairment: Word level Naming: Impairment Responsive: 0-25% accurate Confrontation: Impaired Convergent: 0-24% accurate Verbal Errors: Phonemic paraphasias;Perseveration   Oral / Motor  Oral Motor/Sensory Function Overall Oral Motor/Sensory Function: Moderate impairment Facial ROM: Reduced right Facial Symmetry: Abnormal symmetry right;Suspected CN VII (facial) dysfunction Facial Sensation: Reduced right;Suspected CN V (Trigeminal) dysfunction Lingual Symmetry: Suspected CN XII (hypoglossal) dysfunction;Other (Comment) Motor Speech Overall Motor Speech: Impaired Phonation: Low vocal intensity Articulation: Impaired Level of Impairment: Word   GO            [name] L. [name], [name] CCC/SLP Pager [phone]         [name] [name] 09/25/2016, 1:34 PM

## 2016-09-25 NOTE — Progress Notes (Signed)
STROKE TEAM PROGRESS NOTE   HISTORY OF PRESENT ILLNESS (per record) [name] is an 51 y.o. male with PMH of HTN, alcohol abuse who presents as a stroke alert for aphasia and right-sided weakness.  He was last seen normal around 7:30 PM when his girlfriend/spouse heard a loud thud. Patient was found on the floor and not speaking. EMS was called immediately and found the patient to be plegic on the right side, aphasic and with left gaze deviation.  Blood pressure was 230 systolic at the scene.  On arrival to [facility] ER, the patient was no longer following commands.  CT head showed large left thalamic hemorrhage. The patient has a history of hypertension but not on any blood pressure medications. He is not on aspirin or any blood thinners.  Per the family, pt consumes between 4 and 18 beers nightly.  Pt started on CIWA protocol.  Date last known well: 09.24.18 Time last known well: 7.30 pm NIHSS 24 Modified Rankin: 0  Intracerebral Hemorrhage (ICH) Score  Glascow Coma Score  5-12 1  Age >/=no 0  ICH volume >/= 30ml  yes +1  IVH yes no 0  Infratentorial origin no 0 Total: 2   Patient was not administered IV t-PA secondary to ICH. He was admitted to the neuro ICU for further evaluation and treatment.   SUBJECTIVE (INTERVAL HISTORY) His wife and daughter are at the bedside.  The pt is drowsy but open eyes on voice, and follows limited commands. GCS 10. Expressive aphasia on exam.  Switched nicardipine to clevidipine gtt.  Goal SBP < 140 mmHg.  Alcohol abuse at home. Started on CIWA protocol.  Negative urine tox.  Repeat CT on 09/24/2016 with active bleeding (point sign), slight hematoma expansion, increased midline shift and cerebral edema.   Repeat head CT today with further enlarged hematoma, put on 3% saline. Discussed with neurosurgery Dr. Lovell Sheehan, no role of surgical intervention at this time.   OBJECTIVE Temp:  [97.6 F (36.4 C)-99.6 F (37.6 C)] 99.6 F (37.6 C) (09/25  1200) Pulse Rate:  [68-126] 101 (09/25 1315) Resp:  [16-26] 26 (09/25 1315) BP: (109-211)/(67-135) 139/104 (09/25 1315) SpO2:  [88 %-97 %] 97 % (09/25 1315) Weight:  [94.2 kg (207 lb 10.8 oz)-95.6 kg (210 lb 12.2 oz)] 94.2 kg (207 lb 10.8 oz) (09/24 2310)  CBC:   Recent Labs Lab 09/24/16 2029 09/24/16 2033 09/25/16 0741  WBC 9.8  --  11.3*  NEUTROABS 5.5  --   --   HGB 16.3 16.7 15.8  HCT 46.7 49.0 45.1  MCV 94.7  --  95.8  PLT 187  --  176    Basic Metabolic Panel:   Recent Labs Lab 09/24/16 2029 09/24/16 2033 09/25/16 0741  NA 136 137 135  K 3.4* 3.4* 3.7  CL 101 98* 102  CO2 26  --  25  GLUCOSE 124* 124* 160*  BUN CREATININE 0.88 0.90 0.92  CALCIUM 8.8*  --  8.6*    Lipid Panel: No results found for: CHOL, TRIG, HDL, CHOLHDL, VLDL, LDLCALC HgbA1c: No results found for: HGBA1C Urine Drug Screen:     Component Value Date/Time   LABOPIA NONE DETECTED 09/24/2016 2127   COCAINSCRNUR NONE DETECTED 09/24/2016 2127   LABBENZ NONE DETECTED 09/24/2016 2127   AMPHETMU NONE DETECTED 09/24/2016 2127   THCU NONE DETECTED 09/24/2016 2127   LABBARB NONE DETECTED 09/24/2016 2127    Alcohol Level     Component Value  Date/Time   ETH <5 09/24/2016 2351    IMAGING I have personally reviewed the radiological images below and agree with the radiology interpretations.  Ct Angio Head W Or Wo Contrast Ct Head Code Stroke Wo Contrast  09/24/2016 IMPRESSION: CT HEAD: 1. Large LEFT thalamus and surrounding structure intraparenchymal hematoma. 3 mm LEFT-to-RIGHT midline shift without ventricular entrapment or hydrocephalus. 2. Old RIGHT basal ganglia lacunar infarct. CTA HEAD: 1. Angiographic spot sign central within LEFT hematoma consistent with active hemorrhage. 2. No emergent large vessel occlusion. 3. Severe stenosis LEFT P2 segment most compatible with atherosclerosis. Moderate stenosis anterior circulation, likely due to atherosclerosis though nonspecific in the  presence of hemorrhage.   CT Head Code Stroke 09/24/2016 IMPRESSION: 3.6 x 5.9 x 4.6 cm (volume = 51 cm^3) dense LEFT thalamus, Basal ganglia and external capsule hematoma. 3 mm LEFT-to-RIGHT midline shift. No ventricular entrapment or hydrocephalus. No acute large vascular territory infarct. Old RIGHT basal ganglia lacunar infarct. No abnormal extra-axial fluid collections. Basal cisterns are patent. VASCULAR: Moderate calcific atherosclerosis carotid siphons.  CT Head 09/24/2016 IMPRESSION: 1. Increased size of the intraparenchymal hematoma centered in the left thalamus and basal ganglia, which now has a calculated volume of 59 cc, previously 51 cc. This measurement may underestimate the degree of increase, as there are new irregular extensions arising from the margins of the primary hematoma. No new, remote hemorrhagic foci. 2. Unchanged 4 mm rightward midline shift with mass effect on the ventricles. Basal cisterns remain patent. 3. No intraventricular hemorrhagic extension or hydrocephalus. No ventricular trapping.   CT Head 09/25/2016 1. Enlarging 78 cc hematoma epicenter LEFT deep gray nuclei, increased from 59 cc. New satellite 4 mm hemorrhage. 2. Worsening 7 mm LEFT-to-RIGHT midline shift. No ventricular entrapment. 3. Old RIGHT basal ganglia lacunar infarct.  TTE 09/25/2016 Study Conclusions - Left ventricle: The cavity size was normal. Systolic function was normal. The estimated ejection fraction was in the range of 55% to 60%. Wall motion was normal; there were no regional wall motion abnormalities. Doppler parameters are consistent with abnormal left ventricular relaxation (grade 1 diastolic dysfunction). - Mitral valve: Calcified annulus. Impressions: - Normal LV systolic function; mild diastolic dysfunction.   PHYSICAL EXAM  Temp:  [97.6 F (36.4 C)-99.6 F (37.6 C)] 99.1 F (37.3 C) (09/25 1600) Pulse Rate:  [68-126] 87 (09/25 1535) Resp:  [16-28] 26 (09/25 1535) BP:  (109-211)/(67-135) 137/89 (09/25 1535) SpO2:  [88 %-97 %] 91 % (09/25 1535) Weight:  [207 lb 10.8 oz (94.2 kg)-210 lb 12.2 oz (95.6 kg)] 207 lb 10.8 oz (94.2 kg) (09/24 2310)  General - Well nourished, well developed, drowsy but able to open eyes on voice.  Ophthalmologic - Fundi not visualized due to noncooperation.  Cardiovascular - Regular rate and rhythm.  Neuro - drowsy, open eyes on voice, left-sided forced gaze, resident neglect, expressive aphasia, no speech output, able to follow limited simple commands including close and open eyes, however not following peripheral commands. Not blinking to visual threat on the right. PERRL. right facial droop, tongue midline. Right hemiplegic, 0/5, left upper and lower extremity spontaneous movement against gravity. Right Babinski positive. Sensation, coordination and gait not tested.   ASSESSMENT/PLAN [name] is a 51 y.o. male with history of hypertension and alcohol abuse presenting with aphasia and right-sided weakness.  He did not receive IV t-PA due to ICH.   ICH: Large L thalamic ICH, secondary to untreated hypertensive crisis.  Resultant  left gaze, right neglect, expressive aphasia, right  hemiplegia  CT head:  3.6 x 5.9 x 4.6 cm (volume = 51 cm^3) dense LEFT thalamus, basal ganglia, and external capsule hematoma with 3 mm LEFT-to-RIGHT midline shift.  Repeat CT x 2 gradual enlargement of hematoma to 78cc with increased midline shift  2D Echo: EF 55-60%. No source of embolus  CT repeat pending  LDL pending  HgbA1c pending  SCDs for VTE prophylaxis Diet NPO time specified  No antithrombotic prior to admission, now on No antithrombotic  Patient counseled to be compliant with his antithrombotic medications  Ongoing aggressive stroke risk factor management  Therapy recommendations:  pending  Disposition:  Pending  Cerebral edema  Repeat CT showed gradual enlargement of hematoma with increased midline  shift  Put on 3% saline  23.4% saline PRN for Na goal 150-160  Sodium monitoring  GCS = 9  Discussed with neurosurgery Dr. Lovell Sheehan, no role of surgical intervention at this time  Hypertensive emergency  BP high on admission  On cleviprex for BP goal < 140  Long-term BP goal normotensive  Alcohol abuse  4-18 beers daily  On CIWA protocol  Precedex as needed  B1/FA/MVI  Hyperlipidemia  Home meds: none  LDL pending, goal < 70  Will consider statin once ICH is stable  Other Stroke Risk Factors  Obesity, Body mass index is 32.53 kg/m., recommend weight loss, diet and exercise as appropriate   Other Active Problems  Hyperglycemia  Leukocytosis  Hospital day # 1  This patient is critically ill due to large thalamic ICH, cerebral edema, alcohol abuse, hypertensive emergency and at significant risk of neurological worsening, death form hematoma expansion, brain herniation, seizure, status epilepticus. This patient's care requires constant monitoring of vital signs, hemodynamics, respiratory and cardiac monitoring, review of multiple databases, neurological assessment, discussion with family, other specialists and medical decision making of high complexity. I spent 45 minutes of neurocritical care time in the care of this patient.  Marvel Plan, MD PhD Stroke Neurology 09/25/2016 5:14 PM   To contact Stroke Continuity provider, please refer to [email or web address]. After hours, contact General Neurology

## 2016-09-25 NOTE — Procedures (Signed)
Central Venous Catheter Insertion Procedure Note ARCHIE SHEA [id number] [birth year]  Procedure: Insertion of Central Venous Catheter Indications: Assessment of intravascular volume, Drug and/or fluid administration and Frequent blood sampling  Procedure Details Consent: Risks of procedure as well as the alternatives and risks of each were explained to the (patient/caregiver).  Consent for procedure obtained. Time Out: Verified patient identification, verified procedure, site/side was marked, verified correct patient position, special equipment/implants available, medications/allergies/relevent history reviewed, required imaging and test results available.  Performed  Maximum sterile technique was used including antiseptics, cap, gloves, gown, hand hygiene, mask and sheet. Skin prep: Chlorhexidine; local anesthetic administered A antimicrobial bonded/coated triple lumen catheter was placed in the right internal jugular vein using the Seldinger technique.  Evaluation Blood flow good Complications: No apparent complications Patient did tolerate procedure well. Chest X-ray ordered to verify placement.  CXR: pending.  Procedure performed under direct ultrasound guidance for real time vessel cannulation.      Rutherford Guys, [place] [facility] Pager: [phone]  or [phone] 09/25/2016, 4:56 PM

## 2016-09-25 NOTE — Progress Notes (Signed)
  Echocardiogram 2D Echocardiogram has been performed.  [name] 09/25/2016, 12:01 PM

## 2016-09-25 NOTE — Progress Notes (Signed)
Pt returned from CT scan.  Stroke MD was on unit and was notified of scan and reviewed.  Will continue to monitor.

## 2016-09-25 NOTE — Care Management Note (Signed)
Case Management Note  Patient Details  Name: CORDIE BUENING MRN: [id number] Date of Birth: January 12, 1965  Subjective/Objective:    Pt admitted on 9/24 with ICH.  PTA, pt independent of ADLS.                  Action/Plan: Will follow for discharge planning as pt progresses.    Expected Discharge Date:                  Expected Discharge Plan:     In-House Referral:     Discharge planning Services  CM Consult  Post Acute Care Choice:    Choice offered to:     DME Arranged:    DME Agency:     HH Arranged:    HH Agency:     Status of Service:  In process, will continue to follow  If discussed at Long Length of Stay Meetings, dates discussed:    Additional Comments:  Glennon Mac, RN 09/25/2016, 4:58 PM

## 2016-09-25 NOTE — Progress Notes (Signed)
OT Cancellation Note  Patient Details Name: [name] MRN: [id number] DOB: April 24, 1965   Cancelled Treatment:    Reason Eval/Treat Not Completed: Patient not medically ready (on bedrest.). Please update activity orders when appropriate for OT. Thanks  [facility] Joi Leyva, OT/L  [phone] 09/25/2016 09/25/2016, 7:38 AM

## 2016-09-26 ENCOUNTER — Inpatient Hospital Stay (HOSPITAL_COMMUNITY): Payer: BLUE CROSS/BLUE SHIELD

## 2016-09-26 DIAGNOSIS — E785 Hyperlipidemia, unspecified: Secondary | ICD-10-CM

## 2016-09-26 DIAGNOSIS — R1312 Dysphagia, oropharyngeal phase: Secondary | ICD-10-CM

## 2016-09-26 LAB — LIPID PANEL
CHOLESTEROL: 210 mg/dL — AB (ref 0–200)
HDL: 55 mg/dL (ref >40)
LDL CALC: 124 mg/dL — AB (ref 0–99)
Total CHOL/HDL Ratio: 3.8 RATIO
Triglycerides: 153 mg/dL — ABNORMAL HIGH (ref <150)
VLDL: 31 mg/dL (ref 0–40)

## 2016-09-26 LAB — CBC
HEMATOCRIT: 48.6 % (ref 39.0–52.0)
HEMOGLOBIN: 16.5 g/dL (ref 13.0–17.0)
MCH: 33.4 pg (ref 26.0–34.0)
MCHC: 34 g/dL (ref 30.0–36.0)
MCV: 98.4 fL (ref 78.0–100.0)
Platelets: 207 10*3/uL (ref 150–400)
RBC: 4.94 MIL/uL (ref 4.22–5.81)
RDW: 12.5 % (ref 11.5–15.5)
WBC: 14.3 10*3/uL — AB (ref 4.0–10.5)

## 2016-09-26 LAB — BASIC METABOLIC PANEL
ANION GAP: 10 (ref 5–15)
BUN: 9 mg/dL (ref 6–20)
CALCIUM: 8.3 mg/dL — AB (ref 8.9–10.3)
CO2: 24 mmol/L (ref 22–32)
CREATININE: 0.9 mg/dL (ref 0.61–1.24)
Chloride: 106 mmol/L (ref 101–111)
GFR calc non Af Amer: >60 mL/min (ref >60)
Glucose, Bld: 156 mg/dL — ABNORMAL HIGH (ref 65–99)
Potassium: 3.7 mmol/L (ref 3.5–5.1)
SODIUM: 140 mmol/L (ref 135–145)

## 2016-09-26 LAB — HEMOGLOBIN A1C
HEMOGLOBIN A1C: 5.8 % — AB (ref 4.8–5.6)
MEAN PLASMA GLUCOSE: 119.76 mg/dL

## 2016-09-26 LAB — SODIUM
SODIUM: 140 mmol/L (ref 135–145)
SODIUM: 140 mmol/L (ref 135–145)
Sodium: 143 mmol/L (ref 135–145)
Sodium: 143 mmol/L (ref 135–145)

## 2016-09-26 MED ORDER — CHLORHEXIDINE GLUCONATE CLOTH 2 % EX PADS
6.0000 | MEDICATED_PAD | Freq: Every day | CUTANEOUS | Status: DC
  Administered 2016-09-26 – 2016-09-30 (×5): 6 via TOPICAL

## 2016-09-26 MED ORDER — SODIUM CHLORIDE 0.9% FLUSH: 10.0000 mL | INTRAVENOUS | Status: DC | PRN

## 2016-09-26 MED ORDER — HYDRALAZINE HCL 20 MG/ML IJ SOLN
5.0000 mg | INTRAMUSCULAR | Status: DC | PRN
  Administered 2016-09-26 – 2016-09-27 (×2): 5 mg via INTRAVENOUS
  Filled 2016-09-26 (×2): qty 1

## 2016-09-26 MED ORDER — LABETALOL HCL 5 MG/ML IV SOLN
0.0000 mg/min | INTRAVENOUS | Status: DC
  Administered 2016-09-26: 0.5 mg/min via INTRAVENOUS
  Administered 2016-09-26: 1 mg/min via INTRAVENOUS
  Filled 2016-09-26: qty 100
  Filled 2016-09-26: qty 20

## 2016-09-26 MED ORDER — SODIUM CHLORIDE 0.9% FLUSH
10.0000 mL | Freq: Two times a day (BID) | INTRAVENOUS | Status: DC
  Administered 2016-09-26 – 2016-10-01 (×10): 10 mL
  Administered 2016-10-02: 20 mL
  Administered 2016-10-02 – 2016-10-06 (×8): 10 mL

## 2016-09-26 MED ORDER — SODIUM CHLORIDE 23.4 % INJECTION (4 MEQ/ML) FOR IV ADMINISTRATION
30.0000 mL | Freq: Once | INTRAVENOUS | Status: AC
  Administered 2016-09-26: 30 mL via INTRAVENOUS
  Filled 2016-09-26: qty 30

## 2016-09-26 MED ORDER — LABETALOL HCL 5 MG/ML IV SOLN: 10.0000 mg | INTRAVENOUS | Status: DC | PRN

## 2016-09-26 NOTE — Progress Notes (Signed)
Interim Note  Spoke to nurse, exam stable overnight.  Repeat CT head shows slightly more edema, size of hemorrhage seems stable compared to 9/25. Midline shift and left ventricular effacement similar when compared to prior study. Waiting official CT read.

## 2016-09-26 NOTE — Progress Notes (Signed)
OT Cancellation Note  Patient Details Name: [name] MRN: [id number] DOB: 1965/04/25   Cancelled Treatment:    Reason Eval/Treat Not Completed: Patient not medically ready (on bedrest). Please update activity orders when appropriate for therapy. Thanks  [facility] Deshea Pooley, OT/L  [phone] 09/26/2016 09/26/2016, 6:50 AM

## 2016-09-26 NOTE — Progress Notes (Signed)
STROKE TEAM PROGRESS NOTE   SUBJECTIVE (INTERVAL HISTORY) His son and daughter are at the bedside.  The pt is drowsy but open eyes on voice, and follows limited commands, no significant neuro changes over night. On 3% saline, but Na only 140. Will give 23.4%. Repeat CT showed hematoma stable but increased edema and midline shift. Maximized on cleviprex and will add labetalol IV for BP < 140.    OBJECTIVE Temp:  [98.4 F (36.9 C)-99.4 F (37.4 C)] 99.1 F (37.3 C) (09/26 1200) Pulse Rate:  [78-109] 106 (09/26 0700) Cardiac Rhythm: Normal sinus rhythm (09/26 0400) Resp:  [17-32] 32 (09/26 0700) BP: (129-161)/(81-101) 148/89 (09/26 0700) SpO2:  [91 %-98 %] 94 % (09/26 0700)  CBC:   Recent Labs Lab 09/24/16 2029  09/25/16 0741 09/26/16 0407  WBC 9.8  --  11.3* 14.3*  NEUTROABS 5.5  --   --   --   HGB 16.3  < > 15.8 16.5  HCT 46.7  < > 45.1 48.6  MCV 94.7  --  95.8 98.4  PLT 187  --  176 207  < > = values in this interval not displayed.  Basic Metabolic Panel:   Recent Labs Lab 09/25/16 0741  09/26/16 0407 09/26/16 0630 09/26/16 1300  NA 135  < > 140 140 143  K 3.7  --  3.7  --   --   CL 102  --  106  --   --   CO2 25  --  24  --   --   GLUCOSE 160*  --  156*  --   --   BUN 11  --  9  --   --   CREATININE 0.92  --  0.90  --   --   CALCIUM 8.6*  --  8.3*  --   --   < > = values in this interval not displayed.  Lipid Panel:     Component Value Date/Time   CHOL 210 (H) 09/26/2016 0407   TRIG 153 (H) 09/26/2016 0407   HDL 55 09/26/2016 0407   CHOLHDL 3.8 09/26/2016 0407   VLDL 31 09/26/2016 0407   LDLCALC 124 (H) 09/26/2016 0407   HgbA1c:  Lab Results  Component Value Date   HGBA1C 5.8 (H) 09/26/2016   Urine Drug Screen:     Component Value Date/Time   LABOPIA NONE DETECTED 09/24/2016 2127   COCAINSCRNUR NONE DETECTED 09/24/2016 2127   LABBENZ NONE DETECTED 09/24/2016 2127   AMPHETMU NONE DETECTED 09/24/2016 2127   THCU NONE DETECTED 09/24/2016 2127   LABBARB NONE DETECTED 09/24/2016 2127    Alcohol Level     Component Value Date/Time   ETH <5 09/24/2016 2351    IMAGING I have personally reviewed the radiological images below and agree with the radiology interpretations.  Ct Angio Head W Or Wo Contrast Ct Head Code Stroke Wo Contrast  09/24/2016 IMPRESSION: CT HEAD: 1. Large LEFT thalamus and surrounding structure intraparenchymal hematoma. 3 mm LEFT-to-RIGHT midline shift without ventricular entrapment or hydrocephalus. 2. Old RIGHT basal ganglia lacunar infarct. CTA HEAD: 1. Angiographic spot sign central within LEFT hematoma consistent with active hemorrhage. 2. No emergent large vessel occlusion. 3. Severe stenosis LEFT P2 segment most compatible with atherosclerosis. Moderate stenosis anterior circulation, likely due to atherosclerosis though nonspecific in the presence of hemorrhage.   CT Head Code Stroke 09/24/2016 IMPRESSION: 3.6 x 5.9 x 4.6 cm (volume = 51 cm^3) dense LEFT thalamus, Basal ganglia and external capsule hematoma. 3  mm LEFT-to-RIGHT midline shift. No ventricular entrapment or hydrocephalus. No acute large vascular territory infarct. Old RIGHT basal ganglia lacunar infarct. No abnormal extra-axial fluid collections. Basal cisterns are patent. VASCULAR: Moderate calcific atherosclerosis carotid siphons.  CT Head 09/24/2016 IMPRESSION: 1. Increased size of the intraparenchymal hematoma centered in the left thalamus and basal ganglia, which now has a calculated volume of 59 cc, previously 51 cc. This measurement may underestimate the degree of increase, as there are new irregular extensions arising from the margins of the primary hematoma. No new, remote hemorrhagic foci. 2. Unchanged 4 mm rightward midline shift with mass effect on the ventricles. Basal cisterns remain patent. 3. No intraventricular hemorrhagic extension or hydrocephalus. No ventricular trapping.   CT Head 09/25/2016 1. Enlarging 78 cc hematoma epicenter  LEFT deep gray nuclei, increased from 59 cc. New satellite 4 mm hemorrhage. 2. Worsening 7 mm LEFT-to-RIGHT midline shift. No ventricular entrapment. 3. Old RIGHT basal ganglia lacunar infarct.  TTE 09/25/2016 Study Conclusions - Left ventricle: The cavity size was normal. Systolic function was normal. The estimated ejection fraction was in the range of 55% to 60%. Wall motion was normal; there were no regional wall motion abnormalities. Doppler parameters are consistent with abnormal left ventricular relaxation (grade 1 diastolic dysfunction). - Mitral valve: Calcified annulus. Impressions: - Normal LV systolic function; mild diastolic dysfunction.   PHYSICAL EXAM  Temp:  [98.4 F (36.9 C)-99.4 F (37.4 C)] 99.1 F (37.3 C) (09/26 1200) Pulse Rate:  [78-109] 106 (09/26 0700) Resp:  [17-32] 32 (09/26 0700) BP: (129-161)/(81-101) 148/89 (09/26 0700) SpO2:  [91 %-98 %] 94 % (09/26 0700)  General - Well nourished, well developed, drowsy but able to open eyes on voice.  Ophthalmologic - Fundi not visualized due to noncooperation.  Cardiovascular - Regular rate and rhythm.  Neuro - drowsy, open eyes on voice, left-sided forced gaze, resident neglect, expressive aphasia, no speech output, able to follow limited simple commands including close and open eyes, however not following peripheral commands. Not blinking to visual threat on the right. PERRL. right facial droop, tongue midline. Right hemiplegic, 0/5, left upper and lower extremity spontaneous movement against gravity. Right Babinski positive. Sensation, coordination and gait not tested.   ASSESSMENT/PLAN [name] is a 51 y.o. male with history of hypertension and alcohol abuse presenting with aphasia and right-sided weakness.  He did not receive IV t-PA due to ICH.   ICH: Large L thalamic ICH, secondary to untreated hypertensive crisis.  Resultant  left gaze, right neglect, expressive aphasia, right hemiplegia  CT  head:  3.6 x 5.9 x 4.6 cm (volume = 51 cm^3) dense LEFT thalamus, basal ganglia, and external capsule hematoma with 3 mm LEFT-to-RIGHT midline shift.  Repeat CT x 2 on 09/25/16 gradual enlargement of hematoma to 78cc with increased midline shift  CT repeat 09/26/16 hematoma 81cc with increased midline shift  2D Echo: EF 55-60%. No source of embolus  LDL 124  HgbA1c 5.8  SCDs for VTE prophylaxis Diet NPO time specified  No antithrombotic prior to admission, now on No antithrombotic  Ongoing aggressive stroke risk factor management  Therapy recommendations:  pending  Disposition:  Pending  Cerebral edema  Repeat CT showed gradual enlargement of hematoma with increased midline shift  On 3% saline  23.4% saline PRN for Na goal 150-160  Sodium 136-140-143  GCS = 10  Discussed with neurosurgery Dr. Lovell Sheehan, no role of surgical intervention at this time  Hypertensive emergency  BP high on admission  BP goal < 140  Maximized on cleviprex   Add labetalol IV Initiate po meds once PO access  Alcohol abuse  4-18 beers daily  On CIWA protocol  Precedex as needed  B1/FA/MVI  Hyperlipidemia  Home meds: none  LDL 124, goal < 70  Will consider statin once ICH is stable  Dysphagia   Did not pass swallow  Will need NG tube for meds and TF  Other Stroke Risk Factors  Obesity, Body mass index is 32.53 kg/m., recommend weight loss, diet and exercise as appropriate   Other Active Problems  Hyperglycemia  Leukocytosis 11.3-14.3  Hospital day # 2  This patient is critically ill due to large thalamic ICH, cerebral edema, alcohol abuse, hypertensive emergency and at significant risk of neurological worsening, death form hematoma expansion, brain herniation, seizure, status epilepticus. This patient's care requires constant monitoring of vital signs, hemodynamics, respiratory and cardiac monitoring, review of multiple databases, neurological assessment,  discussion with family, other specialists and medical decision making of high complexity. I spent 35 minutes of neurocritical care time in the care of this patient.  Marvel Plan, MD PhD Stroke Neurology 09/26/2016 2:13 PM   To contact Stroke Continuity provider, please refer to [email or web address]. After hours, contact General Neurology

## 2016-09-26 NOTE — Progress Notes (Signed)
PT Cancellation Note  Patient Details Name: [name] MRN: [id number] DOB: 06/05/1965   Cancelled Treatment:    Reason Eval/Treat Not Completed: Other (comment).  Still monitoring for stability.  Spoke with Dr. Roda Shutters who wants [place] to continue to hold for today.   Thanks,    Rollene Rotunda. Briana Farner, PT, DPT [phone]   09/26/2016, 4:48 PM

## 2016-09-26 NOTE — Progress Notes (Signed)
  Speech Language Pathology Treatment: Dysphagia;Cognitive-Linquistic  Patient Details Name: [name] MRN: [id number] DOB: 05/23/1965 Today's Date: 09/26/2016 Time: 4098-1191 SLP Time Calculation (min) (ACUTE ONLY): 13 min  Assessment / Plan / Recommendation Clinical Impression  Pt with subcortical aphasia with disordered language and speech, today with continued verbal perseverations. Counts from 1-8 and repeats simple single words with difficulty shifting targets and inhibiting perseveratory response, but able to do so with max visual/verbal cues.  Follows simple commands with imitation.    Swallowing marked by continued signs of sensory impairment with likely poor airway protection - overt coughing with ice chips and tspns water.  Pt not ready for instrumental swallow study - recommend consideration of cortrak - D/W RN and RD.    HPI HPI: 51 year old male with past medical history of hypertension,alcohol abuse not on medication presents aphasia and right hemiplegia due to  left thalamic hemorrhage with midline shift and vasogenic edema, likely related to hypertension.       SLP Plan  Continue with current plan of care       Recommendations  Diet recommendations: NPO                Oral Care Recommendations: Oral care QID Plan: Continue with current plan of care       GO                [name] [name] 09/26/2016, 11:12 AM

## 2016-09-27 ENCOUNTER — Inpatient Hospital Stay (HOSPITAL_COMMUNITY): Payer: BLUE CROSS/BLUE SHIELD

## 2016-09-27 LAB — CBC
HCT: 46.5 % (ref 39.0–52.0)
HEMOGLOBIN: 15.3 g/dL (ref 13.0–17.0)
MCH: 33.3 pg (ref 26.0–34.0)
MCHC: 32.9 g/dL (ref 30.0–36.0)
MCV: 101.3 fL — ABNORMAL HIGH (ref 78.0–100.0)
PLATELETS: 159 10*3/uL (ref 150–400)
RBC: 4.59 MIL/uL (ref 4.22–5.81)
RDW: 12.6 % (ref 11.5–15.5)
WBC: 13.3 10*3/uL — ABNORMAL HIGH (ref 4.0–10.5)

## 2016-09-27 LAB — BASIC METABOLIC PANEL
ANION GAP: 5 (ref 5–15)
BUN: 10 mg/dL (ref 6–20)
CALCIUM: 8.4 mg/dL — AB (ref 8.9–10.3)
CO2: 25 mmol/L (ref 22–32)
Chloride: 116 mmol/L — ABNORMAL HIGH (ref 101–111)
Creatinine, Ser: 0.84 mg/dL (ref 0.61–1.24)
Glucose, Bld: 140 mg/dL — ABNORMAL HIGH (ref 65–99)
POTASSIUM: 3.5 mmol/L (ref 3.5–5.1)
SODIUM: 146 mmol/L — AB (ref 135–145)

## 2016-09-27 LAB — SODIUM
SODIUM: 150 mmol/L — AB (ref 135–145)
SODIUM: 152 mmol/L — AB (ref 135–145)
Sodium: 146 mmol/L — ABNORMAL HIGH (ref 135–145)

## 2016-09-27 MED ORDER — PROSIGHT PO TABS
1.0000 | ORAL_TABLET | Freq: Every day | ORAL | Status: DC
  Administered 2016-09-27 – 2016-10-06 (×9): 1 via ORAL
  Filled 2016-09-27 (×10): qty 1

## 2016-09-27 MED ORDER — AMLODIPINE BESYLATE 10 MG PO TABS
10.0000 mg | ORAL_TABLET | Freq: Every day | ORAL | Status: DC
  Administered 2016-09-27 – 2016-10-06 (×7): 10 mg via ORAL
  Filled 2016-09-27 (×7): qty 1

## 2016-09-27 MED ORDER — LISINOPRIL 20 MG PO TABS
20.0000 mg | ORAL_TABLET | Freq: Every day | ORAL | Status: DC
  Administered 2016-09-27: 20 mg via ORAL
  Filled 2016-09-27: qty 1

## 2016-09-27 NOTE — Progress Notes (Signed)
  Speech Language Pathology Treatment: Dysphagia;Cognitive-Linquistic  Patient Details Name: [name] MRN: [id number] DOB: [birth year] Today's Date: 09/27/2016 Time: 0900-0919 SLP Time Calculation (min) (ACUTE ONLY): 19 min  Assessment / Plan / Recommendation Clinical Impression  Diagnostic treatment of dysphagia complete. Patient alert and cooperative. Oral care complete to maximize safety with po intake. Patient to consume trials of ice chips, thin liquids via cup, and pureed solids with no overt s/s of aspiration however oral phase deficits,  multiple swallows, changes in respiration indicate ongoing oropharyngeal dysphagia and raise suspicion for silent aspiration. Recommend MBS to determine extent of deficits and determine potential to initiate a po diet. Cortrak has not been placed, recommend holding off until MBS has been completed. Scheduled for 1200.   Aphasia treatment focused on maximizing ability to make need known. Patient continues to present with subcortical aphasia characterized by significant perseveration, neologisms, phonemic paraphasias with seemingly no awareness. Able to complete basic automatic speech tasks (countin 1-10, days of week, months of year) with 25% accuracy with max visual cueing. Unable to identify objects in f/o 2 despite max [facility] assistance for potential use of AAC device. Will continue treatment.     HPI HPI: 51 year old male with past medical history of hypertension,alcohol abuse not on medication presents aphasia and right hemiplegia due to  left thalamic hemorrhage with midline shift and vasogenic edema, likely related to hypertension.       SLP Plan  MBS       Recommendations  Diet recommendations: NPO Medication Administration: Via alternative means                Oral Care Recommendations: Oral care QID Follow up Recommendations: Inpatient Rehab SLP Visit Diagnosis: Dysphagia, oropharyngeal phase (R13.12);Aphasia (R47.01) Plan:  MBS                   Jireh Vinas MA, CCC-SLP [phone]    Harika Laidlaw Meryl 09/27/2016, 9:23 AM

## 2016-09-27 NOTE — Progress Notes (Signed)
OT Cancellation Note  Patient Details Name: [name] MRN: [id number] DOB: 11-Sep-1965   Cancelled Treatment:    Reason Eval/Treat Not Completed: Patient not medically ready. Pt on bedrest. Please update activity orders when appropriate for therapy. thanks  [facility] Blanch Stang, OT/L  [phone] 09/27/2016 09/27/2016, 8:00 AM

## 2016-09-27 NOTE — Progress Notes (Signed)
PT Cancellation Note  Patient Details Name: [name] MRN: [id number] DOB: Jun 11, 1965   Cancelled Treatment:    Reason Eval/Treat Not Completed: Other (comment); patient was out of room earlier for swallow study.  RN was to check with MD on removing bedrest.  Will attempt again another day.  [facility], [place] [phone] 09/27/2016  Elray Mcgregor 09/27/2016, 4:58 PM

## 2016-09-27 NOTE — Progress Notes (Signed)
Modified Barium Swallow Progress Note  Patient Details  Name: [name] MRN: [id number] Date of Birth: 16-Feb-1965  Today's Date: 09/27/2016  Modified Barium Swallow completed.  Full report located under Chart Review in the Imaging Section.  Brief recommendations include the following:  Clinical Impression  MBS complete but with limitations due to poor positioning as patient with severe pushing to the right and restlessness making ability to fully the airway difficulty. Patient with deep penetration of tsp of honey thick liquid without response due to combination of delay in swallow initiation and decreased laryngeal closure. Unable to utilize compensatory strategies in attempts to prevent due to severity of aphasia. Full airway protection noted with approximately 4 ounces of pureed solid. Pharyngeal strength intact with full pharyngeal clearance post swallow. Recommend initiation of conservative diet (Dysphagia 1, pudding thick liquid) with close supervision during meals and close SLP f/u for tolerance. Repeat instrumental study recommended when patient better able to tolerate testing with either a MBS or FEES.    Swallow Evaluation Recommendations       SLP Diet Recommendations: Dysphagia 1 (Puree) solids;Pudding thick liquid   Liquid Administration via: Spoon   Medication Administration: Crushed with puree   Supervision: Patient able to self feed;Staff to assist with self feeding;Full supervision/cueing for compensatory strategies   Compensations: Slow rate;Small sips/bites;Minimize environmental distractions   Postural Changes: Seated upright at 90 degrees   Oral Care Recommendations: Oral care BID   Other Recommendations: Order thickener from pharmacy;Prohibited food (jello, ice cream, thin soups);Remove water pitcher   William Mula MA, CCC-SLP [phone]  [name] [name] 09/27/2016,1:43 PM

## 2016-09-27 NOTE — Progress Notes (Signed)
STROKE TEAM PROGRESS NOTE   SUBJECTIVE (INTERVAL HISTORY) His RN is at the bedside.  The pt is drowsy but open eyes on voice, and follows limited commands, no significant neuro changes over night. Na 146 this am. BP much better and off labetalol drip and only on cleviprex now. Passed swallow and on dys 1 diet with pudding thick liquid.   OBJECTIVE Temp:  [97 F (36.1 C)-98.4 F (36.9 C)] 97 F (36.1 C) (09/27 0800) Pulse Rate:  [52-83] 63 (09/27 0800) Cardiac Rhythm: Normal sinus rhythm (09/27 0800) Resp:  [14-26] 26 (09/27 0800) BP: (63-143)/(55-92) 126/64 (09/27 0800) SpO2:  [90 %-98 %] 94 % (09/27 0800)  CBC:   Recent Labs Lab 09/24/16 2029  09/26/16 0407 09/27/16 0413  WBC 9.8  < > 14.3* 13.3*  NEUTROABS 5.5  --   --   --   HGB 16.3  < > 16.5 15.3  HCT 46.7  < > 48.6 46.5  MCV 94.7  < > 98.4 101.3*  PLT 187  < > 207 159  < > = values in this interval not displayed.  Basic Metabolic Panel:   Recent Labs Lab 09/26/16 0407  09/27/16 0413 09/27/16 1300  NA 140  < > 146* 150*  K 3.7  --  3.5  --   CL 106  --  116*  --   CO2 24  --  25  --   GLUCOSE 156*  --  140*  --   BUN 9  --  10  --   CREATININE 0.90  --  0.84  --   CALCIUM 8.3*  --  8.4*  --   < > = values in this interval not displayed.  Lipid Panel:     Component Value Date/Time   CHOL 210 (H) 09/26/2016 0407   TRIG 153 (H) 09/26/2016 0407   HDL 55 09/26/2016 0407   CHOLHDL 3.8 09/26/2016 0407   VLDL 31 09/26/2016 0407   LDLCALC 124 (H) 09/26/2016 0407   HgbA1c:  Lab Results  Component Value Date   HGBA1C 5.8 (H) 09/26/2016   Urine Drug Screen:     Component Value Date/Time   LABOPIA NONE DETECTED 09/24/2016 2127   COCAINSCRNUR NONE DETECTED 09/24/2016 2127   LABBENZ NONE DETECTED 09/24/2016 2127   AMPHETMU NONE DETECTED 09/24/2016 2127   THCU NONE DETECTED 09/24/2016 2127   LABBARB NONE DETECTED 09/24/2016 2127    Alcohol Level     Component Value Date/Time   ETH <5 09/24/2016 2351     IMAGING I have personally reviewed the radiological images below and agree with the radiology interpretations.  Ct Angio Head W Or Wo Contrast Ct Head Code Stroke Wo Contrast  09/24/2016 IMPRESSION: CT HEAD: 1. Large LEFT thalamus and surrounding structure intraparenchymal hematoma. 3 mm LEFT-to-RIGHT midline shift without ventricular entrapment or hydrocephalus. 2. Old RIGHT basal ganglia lacunar infarct. CTA HEAD: 1. Angiographic spot sign central within LEFT hematoma consistent with active hemorrhage. 2. No emergent large vessel occlusion. 3. Severe stenosis LEFT P2 segment most compatible with atherosclerosis. Moderate stenosis anterior circulation, likely due to atherosclerosis though nonspecific in the presence of hemorrhage.   CT Head Code Stroke 09/24/2016 IMPRESSION: 3.6 x 5.9 x 4.6 cm (volume = 51 cm^3) dense LEFT thalamus, Basal ganglia and external capsule hematoma. 3 mm LEFT-to-RIGHT midline shift. No ventricular entrapment or hydrocephalus. No acute large vascular territory infarct. Old RIGHT basal ganglia lacunar infarct. No abnormal extra-axial fluid collections. Basal cisterns are patent. VASCULAR: Moderate  calcific atherosclerosis carotid siphons.  CT Head 09/24/2016 IMPRESSION: 1. Increased size of the intraparenchymal hematoma centered in the left thalamus and basal ganglia, which now has a calculated volume of 59 cc, previously 51 cc. This measurement may underestimate the degree of increase, as there are new irregular extensions arising from the margins of the primary hematoma. No new, remote hemorrhagic foci. 2. Unchanged 4 mm rightward midline shift with mass effect on the ventricles. Basal cisterns remain patent. 3. No intraventricular hemorrhagic extension or hydrocephalus. No ventricular trapping.   CT Head 09/25/2016 1. Enlarging 78 cc hematoma epicenter LEFT deep gray nuclei, increased from 59 cc. New satellite 4 mm hemorrhage. 2. Worsening 7 mm LEFT-to-RIGHT midline  shift. No ventricular entrapment. 3. Old RIGHT basal ganglia lacunar infarct.  TTE 09/25/2016 Study Conclusions - Left ventricle: The cavity size was normal. Systolic function was normal. The estimated ejection fraction was in the range of 55% to 60%. Wall motion was normal; there were no regional wall motion abnormalities. Doppler parameters are consistent with abnormal left ventricular relaxation (grade 1 diastolic dysfunction). - Mitral valve: Calcified annulus. Impressions: - Normal LV systolic function; mild diastolic dysfunction.  CT head pending   PHYSICAL EXAM  Temp:  [97 F (36.1 C)-98.4 F (36.9 C)] 97 F (36.1 C) (09/27 0800) Pulse Rate:  [52-83] 63 (09/27 0800) Resp:  [14-26] 26 (09/27 0800) BP: (63-143)/(55-92) 126/64 (09/27 0800) SpO2:  [90 %-98 %] 94 % (09/27 0800)  General - Well nourished, well developed, drowsy but able to open eyes on voice.  Ophthalmologic - Fundi not visualized due to noncooperation.  Cardiovascular - Regular rate and rhythm.  Neuro - drowsy, open eyes on voice, left-sided forced gaze, resident neglect, expressive aphasia, no speech output, able to follow limited simple commands including close and open eyes, however not following peripheral commands. Not blinking to visual threat on the right. PERRL. right facial droop, tongue midline. Right hemiplegic, 0/5, left upper and lower extremity spontaneous movement against gravity. Right Babinski positive. Sensation, coordination and gait not tested.   ASSESSMENT/PLAN [name] is a 51 y.o. male with history of hypertension and alcohol abuse presenting with aphasia and right-sided weakness.  He did not receive IV t-PA due to ICH.   ICH: Large L thalamic ICH, secondary to untreated hypertensive crisis.  Resultant  left gaze, right neglect, expressive aphasia, right hemiplegia  CT head:  3.6 x 5.9 x 4.6 cm (volume = 51 cm^3) dense LEFT thalamus, basal ganglia, and external capsule  hematoma with 3 mm LEFT-to-RIGHT midline shift.  Repeat CT x 2 on 09/25/16 gradual enlargement of hematoma to 78cc with increased midline shift  CT repeat 09/26/16 hematoma 81cc with increased midline shift  CT repeat pending  2D Echo: EF 55-60%. No source of embolus  LDL 124  HgbA1c 5.8  SCDs for VTE prophylaxis DIET - DYS 1 Room service appropriate? Yes; Fluid consistency: Pudding Thick  No antithrombotic prior to admission, now on No antithrombotic  Ongoing aggressive stroke risk factor management  Therapy recommendations:  pending  Disposition:  Pending  Cerebral edema  Repeat CT showed gradual enlargement of hematoma with increased midline shift  Repeat CT pending  On 3% saline  23.4% saline PRN for Na goal 150-160  Sodium 136-140-143-146  GCS = 10  Discussed with neurosurgery Dr. Lovell Sheehan, no role of surgical intervention at this time  Hypertensive emergency  BP high on admission  BP goal < 140  Still on cleviprex   off labetalol  IV Add po norvasc and lisinopril today  Alcohol abuse  4-18 beers daily  On CIWA protocol  Precedex as needed  B1/FA/MVI  Hyperlipidemia  Home meds: none  LDL 124, goal < 70  Will consider statin once ICH is stable  Dysphagia   pass swallow  On dys 1 and pudding thick liquid  Close supervision  Other Stroke Risk Factors  Obesity, Body mass index is 32.53 kg/m., recommend weight loss, diet and exercise as appropriate   Other Active Problems  Hyperglycemia  Leukocytosis 11.3-14.3->13.3  Hospital day # 3  This patient is critically ill due to large thalamic ICH, cerebral edema, alcohol abuse, hypertensive emergency and at significant risk of neurological worsening, death form hematoma expansion, brain herniation, seizure, status epilepticus. This patient's care requires constant monitoring of vital signs, hemodynamics, respiratory and cardiac monitoring, review of multiple databases, neurological  assessment, discussion with family, other specialists and medical decision making of high complexity. I spent 35 minutes of neurocritical care time in the care of this patient.  Marvel Plan, MD PhD Stroke Neurology 09/27/2016 2:09 PM   To contact Stroke Continuity provider, please refer to [email or web address]. After hours, contact General Neurology

## 2016-09-27 NOTE — Progress Notes (Signed)
Rehab Admissions Coordinator Note:  Patient was screened by Trish Mage for appropriateness for an Inpatient Acute Rehab Consult. Currently on bedrest.  Will await PT/OT evaluations and recommendations and then will follow up for rehab needs.  Lelon Frohlich M 09/27/2016, 9:24 AM  I can be reached at [phone].

## 2016-09-28 ENCOUNTER — Inpatient Hospital Stay (HOSPITAL_COMMUNITY): Payer: BLUE CROSS/BLUE SHIELD

## 2016-09-28 DIAGNOSIS — G8191 Hemiplegia, unspecified affecting right dominant side: Secondary | ICD-10-CM

## 2016-09-28 DIAGNOSIS — I611 Nontraumatic intracerebral hemorrhage in hemisphere, cortical: Secondary | ICD-10-CM

## 2016-09-28 DIAGNOSIS — R4701 Aphasia: Secondary | ICD-10-CM

## 2016-09-28 LAB — BASIC METABOLIC PANEL
ANION GAP: 6 (ref 5–15)
BUN: 12 mg/dL (ref 6–20)
CHLORIDE: 120 mmol/L — AB (ref 101–111)
CO2: 26 mmol/L (ref 22–32)
Calcium: 8.4 mg/dL — ABNORMAL LOW (ref 8.9–10.3)
Creatinine, Ser: 0.8 mg/dL (ref 0.61–1.24)
GFR calc non Af Amer: >60 mL/min (ref >60)
Glucose, Bld: 129 mg/dL — ABNORMAL HIGH (ref 65–99)
Potassium: 3.2 mmol/L — ABNORMAL LOW (ref 3.5–5.1)
Sodium: 152 mmol/L — ABNORMAL HIGH (ref 135–145)

## 2016-09-28 LAB — CBC
HEMATOCRIT: 46.2 % (ref 39.0–52.0)
HEMOGLOBIN: 15 g/dL (ref 13.0–17.0)
MCH: 33.3 pg (ref 26.0–34.0)
MCHC: 32.5 g/dL (ref 30.0–36.0)
MCV: 102.4 fL — ABNORMAL HIGH (ref 78.0–100.0)
Platelets: 146 10*3/uL — ABNORMAL LOW (ref 150–400)
RBC: 4.51 MIL/uL (ref 4.22–5.81)
RDW: 12.6 % (ref 11.5–15.5)
WBC: 13.8 10*3/uL — AB (ref 4.0–10.5)

## 2016-09-28 LAB — SODIUM
SODIUM: 150 mmol/L — AB (ref 135–145)
SODIUM: 151 mmol/L — AB (ref 135–145)
SODIUM: 152 mmol/L — AB (ref 135–145)

## 2016-09-28 MED ORDER — HYDRALAZINE HCL 20 MG/ML IJ SOLN
5.0000 mg | INTRAMUSCULAR | Status: DC | PRN
  Administered 2016-09-30: 5 mg via INTRAVENOUS
  Filled 2016-09-28 (×2): qty 1

## 2016-09-28 MED ORDER — LISINOPRIL 20 MG PO TABS
20.0000 mg | ORAL_TABLET | Freq: Two times a day (BID) | ORAL | Status: DC
  Administered 2016-09-28 – 2016-10-06 (×10): 20 mg via ORAL
  Filled 2016-09-28 (×11): qty 1

## 2016-09-28 MED ORDER — FOLIC ACID 1 MG PO TABS
1.0000 mg | ORAL_TABLET | Freq: Every day | ORAL | Status: DC
  Administered 2016-09-29 – 2016-10-06 (×7): 1 mg via ORAL
  Filled 2016-09-28 (×7): qty 1

## 2016-09-28 MED ORDER — VITAMIN B-1 100 MG PO TABS
100.0000 mg | ORAL_TABLET | Freq: Every day | ORAL | Status: DC
  Administered 2016-09-29 – 2016-10-06 (×7): 100 mg via ORAL
  Filled 2016-09-28 (×7): qty 1

## 2016-09-28 NOTE — Progress Notes (Signed)
STROKE TEAM PROGRESS NOTE   SUBJECTIVE (INTERVAL HISTORY) His RN is at the bedside.  The pt is more awake alert and open eyes, and able to open/close eyes as requested but not following other commands, no significant neuro changes over night. Na 152 this am. BP high if he is agitated. Still on cleviprex low dose. Increase po bp meds. On diet. Updated daughter Paul Half on the phone.   OBJECTIVE Temp:  [97.6 F (36.4 C)-98.9 F (37.2 C)] 98.7 F (37.1 C) (09/28 1200) Pulse Rate:  [46-72] 72 (09/28 0700) Cardiac Rhythm: Normal sinus rhythm;Sinus bradycardia (09/27 2000) Resp:  [12-25] 12 (09/28 0700) BP: (105-165)/(65-116) 147/92 (09/28 0700) SpO2:  [93 %-98 %] 98 % (09/28 0700)  CBC:   Recent Labs Lab 09/24/16 2029  09/27/16 0413 09/28/16 0403  WBC 9.8  < > 13.3* 13.8*  NEUTROABS 5.5  --   --   --   HGB 16.3  < > 15.3 15.0  HCT 46.7  < > 46.5 46.2  MCV 94.7  < > 101.3* 102.4*  PLT 187  < > 159 146*  < > = values in this interval not displayed.  Basic Metabolic Panel:   Recent Labs Lab 09/27/16 0413  09/28/16 0403 09/28/16 1240  NA 146*  < > 152* 152*  K 3.5  --  3.2*  --   CL 116*  --  120*  --   CO2 25  --  26  --   GLUCOSE 140*  --  129*  --   BUN 10  --  12  --   CREATININE 0.84  --  0.80  --   CALCIUM 8.4*  --  8.4*  --   < > = values in this interval not displayed.  Lipid Panel:     Component Value Date/Time   CHOL 210 (H) 09/26/2016 0407   TRIG 153 (H) 09/26/2016 0407   HDL 55 09/26/2016 0407   CHOLHDL 3.8 09/26/2016 0407   VLDL 31 09/26/2016 0407   LDLCALC 124 (H) 09/26/2016 0407   HgbA1c:  Lab Results  Component Value Date   HGBA1C 5.8 (H) 09/26/2016   Urine Drug Screen:     Component Value Date/Time   LABOPIA NONE DETECTED 09/24/2016 2127   COCAINSCRNUR NONE DETECTED 09/24/2016 2127   LABBENZ NONE DETECTED 09/24/2016 2127   AMPHETMU NONE DETECTED 09/24/2016 2127   THCU NONE DETECTED 09/24/2016 2127   LABBARB NONE DETECTED 09/24/2016 2127     Alcohol Level     Component Value Date/Time   ETH <5 09/24/2016 2351    IMAGING I have personally reviewed the radiological images below and agree with the radiology interpretations.  Ct Angio Head W Or Wo Contrast Ct Head Code Stroke Wo Contrast  09/24/2016 IMPRESSION: CT HEAD: 1. Large LEFT thalamus and surrounding structure intraparenchymal hematoma. 3 mm LEFT-to-RIGHT midline shift without ventricular entrapment or hydrocephalus. 2. Old RIGHT basal ganglia lacunar infarct. CTA HEAD: 1. Angiographic spot sign central within LEFT hematoma consistent with active hemorrhage. 2. No emergent large vessel occlusion. 3. Severe stenosis LEFT P2 segment most compatible with atherosclerosis. Moderate stenosis anterior circulation, likely due to atherosclerosis though nonspecific in the presence of hemorrhage.   CT Head Code Stroke 09/24/2016 IMPRESSION: 3.6 x 5.9 x 4.6 cm (volume = 51 cm^3) dense LEFT thalamus, Basal ganglia and external capsule hematoma. 3 mm LEFT-to-RIGHT midline shift. No ventricular entrapment or hydrocephalus. No acute large vascular territory infarct. Old RIGHT basal ganglia lacunar infarct. No abnormal  extra-axial fluid collections. Basal cisterns are patent. VASCULAR: Moderate calcific atherosclerosis carotid siphons.  CT Head 09/24/2016 IMPRESSION: 1. Increased size of the intraparenchymal hematoma centered in the left thalamus and basal ganglia, which now has a calculated volume of 59 cc, previously 51 cc. This measurement may underestimate the degree of increase, as there are new irregular extensions arising from the margins of the primary hematoma. No new, remote hemorrhagic foci. 2. Unchanged 4 mm rightward midline shift with mass effect on the ventricles. Basal cisterns remain patent. 3. No intraventricular hemorrhagic extension or hydrocephalus. No ventricular trapping.   CT Head 09/25/2016 1. Enlarging 78 cc hematoma epicenter LEFT deep gray nuclei, increased from 59  cc. New satellite 4 mm hemorrhage. 2. Worsening 7 mm LEFT-to-RIGHT midline shift. No ventricular entrapment. 3. Old RIGHT basal ganglia lacunar infarct.  TTE 09/25/2016 Study Conclusions - Left ventricle: The cavity size was normal. Systolic function was normal. The estimated ejection fraction was in the range of 55% to 60%. Wall motion was normal; there were no regional wall motion abnormalities. Doppler parameters are consistent with abnormal left ventricular relaxation (grade 1 diastolic dysfunction). - Mitral valve: Calcified annulus. Impressions: - Normal LV systolic function; mild diastolic dysfunction.  CT head 09/28/16 Large hematoma left frontal parietal operculum unchanged. Increased low-density edema surrounding the hematoma. 9 mm midline shift to the right unchanged.   PHYSICAL EXAM  Temp:  [97.6 F (36.4 C)-98.9 F (37.2 C)] 98.7 F (37.1 C) (09/28 1200) Pulse Rate:  [46-72] 72 (09/28 0700) Resp:  [12-25] 12 (09/28 0700) BP: (105-165)/(65-116) 147/92 (09/28 0700) SpO2:  [93 %-98 %] 98 % (09/28 0700)  General - Well nourished, well developed, drowsy but able to open eyes on voice.  Ophthalmologic - Fundi not visualized due to noncooperation.  Cardiovascular - Regular rate and rhythm.  Neuro - drowsy, open eyes on voice, left-sided forced gaze, resident neglect, expressive aphasia, no speech output, able to follow limited simple commands including close and open eyes, however not following peripheral commands. Not blinking to visual threat on the right. PERRL. right facial droop, tongue midline. Right hemiplegic, 0/5, left upper and lower extremity spontaneous movement against gravity. Right Babinski positive. Sensation, coordination not cooperative and gait not tested.    ASSESSMENT/PLAN [name] is a 51 y.o. male with history of hypertension and alcohol abuse presenting with aphasia and right-sided weakness.  He did not receive IV t-PA due to ICH.   ICH:  Large L thalamic ICH, secondary to untreated hypertensive crisis.  Resultant  left gaze, right neglect, expressive aphasia, right hemiplegia  CT head:  3.6 x 5.9 x 4.6 cm (volume = 51 cm^3) dense LEFT thalamus, basal ganglia, and external capsule hematoma with 3 mm LEFT-to-RIGHT midline shift.  Repeat CT x 2 on 09/25/16 gradual enlargement of hematoma to 78cc with increased midline shift  CT repeat 09/26/16 hematoma 81cc with increased midline shift  CT repeat stable hematoma and slight increase of edema and midline shift  2D Echo: EF 55-60%. No source of embolus  LDL 124  HgbA1c 5.8  SCDs for VTE prophylaxis DIET - DYS 1 Room service appropriate? Yes; Fluid consistency: Pudding Thick  No antithrombotic prior to admission, now on No antithrombotic  Ongoing aggressive stroke risk factor management  Therapy recommendations:  pending  Disposition:  Pending  Cerebral edema  Repeat CT showed gradual enlargement of hematoma with increased midline shift  CT repeat 09/28/16 stable hematoma and slight increase of edema and midline shift  On  3% saline  23.4% saline PRN for Na goal 150-160  Sodium 136-140-143-146-152  Stable mental status  Discussed with neurosurgery Dr. Lovell Sheehan, no role of surgical intervention at this time  Hypertensive emergency  BP high on admission  BP goal < 140  Still on cleviprex   off labetalol IV On novasc and lisinopril Increase lisinopril dose  Alcohol abuse  4-18 beers daily  On CIWA protocol  Precedex as needed  B1/FA/MVI  Hyperlipidemia  Home meds: none  LDL 124, goal < 70  No need to initiate statin at acute ICH phase.   Dysphagia   pass swallow  On dys 1 and pudding thick liquid  Close supervision  Other Stroke Risk Factors  Obesity, Body mass index is 32.53 kg/m., recommend weight loss, diet and exercise as appropriate   Other Active Problems  Hyperglycemia  Leukocytosis 11.3-14.3->13.3->13.8,  afebrile  Hospital day # 4  This patient is critically ill due to large thalamic ICH, cerebral edema, alcohol abuse, hypertensive emergency and at significant risk of neurological worsening, death form hematoma expansion, brain herniation, seizure, status epilepticus. This patient's care requires constant monitoring of vital signs, hemodynamics, respiratory and cardiac monitoring, review of multiple databases, neurological assessment, discussion with family, other specialists and medical decision making of high complexity. I spent 35 minutes of neurocritical care time in the care of this patient.  Marvel Plan, MD PhD Stroke Neurology 09/28/2016 1:44 PM   To contact Stroke Continuity provider, please refer to [email or web address]. After hours, contact General Neurology

## 2016-09-28 NOTE — Progress Notes (Signed)
Inpatient Rehabilitation  Met with patient and son-in-law, to discuss team's recommendations for IP Rehab.  Shared booklets and a brief overview of our program.  Will follow for timing of medical readiness, insurance authorization, and IP Rehab bed availability.  Plan to follow up with daughter Monday afternoon.    Carmelia Roller., CCC/SLP Admission Coordinator  [facility]  Cell [phone]

## 2016-09-28 NOTE — Evaluation (Signed)
Physical Therapy Evaluation Patient Details Name: [name] MRN: [id number] DOB: [birth year] Today's Date: 09/28/2016   History of Present Illness   51 y.o. male with PMH of HTN, right basal ganglia infarct and alcohol abuse. Admitted with aphasia and right-sided weakness with left thalamic hemorrhage with midline shift  Clinical Impression  Pt with expressive and receptive aphasia with right hemiplegia. Pt able to assist with transfers and functional mobility with max assist and max cues. Pt with left gaze preference but able to cross midline with increased cues and time. Pt was independent, working and caring for himself with limited caregiver availability who demonstrates above and below deficits with impaired strength, function, communication, transfers, balance and mobility who will benefit from acute therapy to maximize function, independence and strength to decrease burden of care.  VSS throughout    Follow Up Recommendations CIR;Supervision/Assistance - 24 hour    Equipment Recommendations  3in1 (PT)    Recommendations for Other Services       Precautions / Restrictions Precautions Precautions: Fall Precaution Comments: right neglect and hemiplegia      Mobility  Bed Mobility Overal bed mobility: Needs Assistance Bed Mobility: Supine to Sit     Supine to sit: Max assist;HOB elevated     General bed mobility comments: assist to move RLE to EOB, pt attempting to use LLE to move RLE but unable to accomplish. Assist to elevate trunk and fully pivot to EOB  Transfers Overall transfer level: Needs assistance   Transfers: Sit to/from [employer] Transfers Sit to Stand: Max assist;+2 physical assistance;+2 safety/equipment   Squat pivot transfers: Max assist;+2 physical assistance;+2 safety/equipment     General transfer comment: max assist with RLE blocked, cues for sequence and assist to rise from bed. In standing pt pushing right with no quad activation and  max assist for balance. In standing worked on weight shifting left with max assist. Pivot to chair with pt able to reach with LUE for surface but unable to pivot pelvis without max assist  Ambulation/Gait                Stairs            Wheelchair Mobility    Modified Rankin (Stroke Patients Only) Modified Rankin (Stroke Patients Only) Pre-Morbid Rankin Score: No symptoms Modified Rankin: Severe disability     Balance Overall balance assessment: Needs assistance   Sitting balance-Leahy Scale: Fair Sitting balance - Comments: initially min assist for balance but once in midline able to maintain without assist     Standing balance-Leahy Scale: Zero                               Pertinent Vitals/Pain Pain Assessment:  (CPOT= 0)    Home Living Family/patient expects to be discharged to:: Inpatient rehab Living Arrangements: Spouse/significant other Available Help at Discharge: Available PRN/intermittently Type of Home: House Home Access: Stairs to enter     Home Layout: Two level;Laundry or work area in basement;Able to live on main level with bedroom/bathroom Home Equipment: Information systems manager - built in;Walker - 2 wheels Additional Comments: girlfriend works Radio broadcast assistant shifts    Prior Function Level of Independence: Independent         Comments: works in Therapist, music care, Museum/gallery conservator        Extremity/Trunk Assessment   Upper Extremity Assessment Upper Extremity Assessment: Defer to OT evaluation  Lower Extremity Assessment Lower Extremity Assessment: RLE deficits/detail RLE Deficits / Details: no AROM and no withdrawal to pain with nailbed pressure, PROM WFL    Cervical / Trunk Assessment Cervical / Trunk Assessment: Other exceptions Cervical / Trunk Exceptions: preference for left neck rotation and gaze  Communication   Communication: Expressive difficulties  Cognition Arousal/Alertness:  Awake/alert Behavior During Therapy: Restless Overall Cognitive Status: Impaired/Different from baseline                                        General Comments      Exercises     Assessment/Plan    PT Assessment Patient needs continued PT services  PT Problem List Decreased strength;Decreased mobility;Decreased safety awareness;Decreased range of motion;Decreased activity tolerance;Decreased balance;Decreased knowledge of use of DME;Impaired sensation;Decreased cognition;Decreased coordination       PT Treatment Interventions DME instruction;Therapeutic activities;Cognitive remediation;Gait training;Therapeutic exercise;Patient/family education;Balance training;Functional mobility training;Neuromuscular re-education    PT Goals (Current goals can be found in the Care Plan section)  Acute Rehab PT Goals Patient Stated Goal: progress mobility PT Goal Formulation: With family Time For Goal Achievement: 10/12/16 Potential to Achieve Goals: Fair    Frequency Min 4X/week   Barriers to discharge Decreased caregiver support lives with girlfriend who works 12hr shifts    Co-evaluation PT/OT/SLP Co-Evaluation/Treatment: Yes Reason for Co-Treatment: Complexity of the patient's impairments (multi-system involvement);For patient/therapist safety;Necessary to address cognition/behavior during functional activity PT goals addressed during session: Mobility/safety with mobility;Balance         AM-PAC PT "6 Clicks" Daily Activity  Outcome Measure Difficulty turning over in bed (including adjusting bedclothes, sheets and blankets)?: Unable Difficulty moving from lying on back to sitting on the side of the bed? : Unable Difficulty sitting down on and standing up from a chair with arms (e.g., wheelchair, bedside commode, etc,.)?: Unable Help needed moving to and from a bed to chair (including a wheelchair)?: Total Help needed walking in hospital room?: Total Help needed  climbing 3-5 steps with a railing? : Total 6 Click Score: 6    End of Session Equipment Utilized During Treatment: Gait belt Activity Tolerance: Patient tolerated treatment well Patient left: in chair;with chair alarm set;with call bell/phone within reach;with nursing/sitter in room Nurse Communication: Mobility status;Precautions PT Visit Diagnosis: Hemiplegia and hemiparesis;Other symptoms and signs involving the nervous system (R29.898);Other abnormalities of gait and mobility (R26.89) Hemiplegia - Right/Left: Right Hemiplegia - dominant/non-dominant: Dominant Hemiplegia - caused by: Other Nontraumatic intracranial hemorrhage    Time: 1610-9604 PT Time Calculation (min) (ACUTE ONLY): 22 min   Charges:   PT Evaluation $PT Eval Moderate Complexity: 1 Mod     PT G Codes:        Delaney Meigs, PT [phone]   Tifini Reeder B Candyce Gambino 09/28/2016, 11:04 AM

## 2016-09-28 NOTE — Consult Note (Signed)
Physical Medicine and Rehabilitation Consult Reason for Consult: Right-sided weakness and aphasia Referring Physician: Dr.Xu   HPI: [name] is a 51 y.o. right handed male with history of hypertension, alcohol abuse. Presented 09/24/2016 with acute onset of right-sided weakness and aphasia. Per chart review patient lives with girlfriend. Independent prior to admission. Patient works in Therapist, music care. Girlfriend works 12 hour rotating shifts. 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 3 mm left-to-right midline shift. 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 CTA of head with no emergent large vessel occlusion. Blood pressure with systolic in the 230s. Echocardiogram ejection fraction of 60% grade 1 diastolic dysfunction. Patient currently remains on Precedex. Dysphagia #1 pudding thick liquid diet. Physical occupational therapy evaluations completed with recommendations of physical medicine rehabilitation consult.   Review of Systems  Unable to perform ROS: Language   Past Medical History:  Diagnosis Date  . Hypertension    No past surgical history on file. No family history on file. Social History:  reports that he has never smoked. He has never used smokeless tobacco. He reports that he drinks alcohol. His drug history is not on file. Allergies: No Known Allergies Medications Prior to Admission  Medication Sig Dispense Refill  . aspirin EC 81 MG tablet Take 81 mg by mouth as needed for mild pain.      Home: Home Living Family/patient expects to be discharged to:: Inpatient rehab Living Arrangements: Spouse/significant other Available Help at Discharge: Available PRN/intermittently Type of Home: House Home Access: Stairs to enter Home Layout: Two level, Laundry or work area in basement, Able to live on main level with bedroom/bathroom Bathroom Shower/Tub: Health visitor: Standard Home Equipment:  Information systems manager - built in, Environmental consultant - 2 wheels Additional Comments: girlfriend works Radio broadcast assistant shifts  Functional History: Prior Function Level of Independence: Independent Comments: works in Therapist, music care, Armed forces training and education officer Functional Status:  Mobility: Bed Mobility Overal bed mobility: Needs Assistance Bed Mobility: Supine to Sit Supine to sit: Max assist, HOB elevated General bed mobility comments: assist to move RLE to EOB, pt attempting to use LLE to move RLE but unable to accomplish. Assist to elevate trunk and fully pivot to EOB Transfers Overall transfer level: Needs assistance Equipment used: 2 person hand held assist Transfers: Sit to/from Stand, Squat Pivot Transfers Sit to Stand: Max assist, +2 physical assistance, +2 safety/equipment Squat pivot transfers: Max assist, +2 physical assistance, +2 safety/equipment General transfer comment: max assist with RLE blocked, cues for sequence and assist to rise from bed. In standing pt pushing right with no quad activation and max assist for balance. In standing worked on weight shifting left with max assist. Pivot to chair with pt able to reach with LUE for surface but unable to pivot pelvis without max assist      ADL: ADL Overall ADL's : Needs assistance/impaired Grooming: Sitting, Wash/dry face, Minimal assistance Grooming Details (indicate cue type and reason): Cues for initiation and termination of task Upper Body Bathing: Maximal assistance, Sitting Lower Body Bathing: Maximal assistance, Sit to/from stand Upper Body Dressing : Maximal assistance, Sitting Lower Body Dressing: Maximal assistance, Sit to/from stand Toilet Transfer: Maximal assistance, +2 for physical assistance, Squat-pivot, BSC Toilet Transfer Details (indicate cue type and reason): simulated by transfer EOB>chair Functional mobility during ADLs: Maximal assistance, +2 for physical assistance (for squat pivot)  Cognition: Cognition Overall Cognitive  Status: Difficult to assess Arousal/Alertness: Awake/alert Orientation Level: Other (comment) (UTA)  Attention: Focused Cognition Arousal/Alertness: Awake/alert Behavior During Therapy: Restless, Impulsive Overall Cognitive Status: Difficult to assess Difficult to assess due to: Impaired communication (pt with expressive aphasia)  Blood pressure (!) 147/92, pulse 72, temperature 98.7 F (37.1 C), temperature source Oral, resp. rate 12, height  (1.702 m), weight 94.2 kg (207 lb 10.8 oz), SpO2 98 %. Physical Exam  Constitutional: He appears well-developed. No distress.  HENT:  Right facial droop  Eyes:  Pupils reactive to light. Patient with left gaze preference  Neck: Normal range of motion. Neck supple. No thyromegaly present.  Cardiovascular: Normal rate, regular rhythm and normal heart sounds.   Respiratory: Effort normal and breath sounds normal. No respiratory distress.  GI: Soft. Bowel sounds are normal. He exhibits no distension.  Neurological: He is alert.  Patient is aphasic. He did follow some simple commands but inconsistently, Less than 30%. Perseverated on certain commands once he processed them. RUE and RLE 0/5. DTR's 3+, toes up. Did sense deep pain. LUE and LLE moved freely.   Skin: Skin is warm and dry.  Psychiatric:  Restless,     Results for orders placed or performed during the hospital encounter of 09/24/16 (from the past 24 hour(s))  Sodium     Status: Abnormal   Collection Time: 09/27/16  6:59 PM  Result Value Ref Range   Sodium 152 (H) 135 - 145 mmol/L  Sodium     Status: Abnormal   Collection Time: 09/28/16 12:32 AM  Result Value Ref Range   Sodium 151 (H) 135 - 145 mmol/L  CBC     Status: Abnormal   Collection Time: 09/28/16  4:03 AM  Result Value Ref Range   WBC 13.8 (H) 4.0 - 10.5 K/uL   RBC 4.51 4.22 - 5.81 MIL/uL   Hemoglobin 15.0 13.0 - 17.0 g/dL   HCT 16.1 09.6 - 04.5 %   MCV 102.4 (H) 78.0 - 100.0 fL   MCH 33.3 26.0 - 34.0 pg   MCHC  32.5 30.0 - 36.0 g/dL   RDW 40.9 81.1 - 91.4 %   Platelets 146 (L) 150 - 400 K/uL  Basic metabolic panel     Status: Abnormal   Collection Time: 09/28/16  4:03 AM  Result Value Ref Range   Sodium 152 (H) 135 - 145 mmol/L   Potassium 3.2 (L) 3.5 - 5.1 mmol/L   Chloride 120 (H) 101 - 111 mmol/L   CO2 26 22 - 32 mmol/L   Glucose, Bld 129 (H) 65 - 99 mg/dL   BUN 12 6 - 20 mg/dL   Creatinine, Ser 7.82 0.61 - 1.24 mg/dL   Calcium 8.4 (L) 8.9 - 10.3 mg/dL   GFR calc non Af Amer >60 >60 mL/min   GFR calc Af Amer >60 >60 mL/min   Anion gap 6 5 - 15  Sodium     Status: Abnormal   Collection Time: 09/28/16 12:40 PM  Result Value Ref Range   Sodium 152 (H) 135 - 145 mmol/L   Ct Head Wo Contrast  Result Date: 09/28/2016 CLINICAL DATA:  Intracranial hemorrhage followup EXAM: CT HEAD WITHOUT CONTRAST TECHNIQUE: Contiguous axial images were obtained from the base of the skull through the vertex without intravenous contrast. COMPARISON:  CT head 09/26/2016 FINDINGS: Brain: High-density hemorrhage in the left frontal parietal operculum measures 6.4 x 4.3 cm, unchanged in size. Large amount of surrounding low-density edema has progressed. 9 mm midline shift to the right unchanged. Ventricles not enlarged. No intraventricular or subarachnoid  hemorrhage identified. Vascular: Negative for hyperdense vessel Skull: Negative Sinuses/Orbits: Negative Other: None IMPRESSION: Large hematoma left frontal parietal operculum unchanged. Increased low-density edema surrounding the hematoma. 9 mm midline shift to the right unchanged. Electronically Signed   By: Marlan Palau M.D.   On: 09/28/2016 07:53   Dg Swallowing Func-speech Pathology  Result Date: 09/27/2016 Objective Swallowing Evaluation: Type of Study: MBS-Modified Barium Swallow Study Patient Details Name: TRIGG DELAROCHA MRN: [id number] Date of Birth: 1965/09/24 Today's Date: 09/27/2016 Time: SLP Start Time (ACUTE ONLY): 1212-SLP Stop Time (ACUTE ONLY): 1240 SLP  Time Calculation (min) (ACUTE ONLY): 28 min Past Medical History: Past Medical History: Diagnosis Date . Hypertension  Past Surgical History: No past surgical history on file. HPI: 51 year old male with past medical history of hypertension,alcohol abuse not on medication presents aphasia and right hemiplegia due to  left thalamic hemorrhage with midline shift and vasogenic edema, likely related to hypertension.  Subjective: dtr, wife present Assessment / Plan / Recommendation CHL IP CLINICAL IMPRESSIONS 09/27/2016 Clinical Impression MBS complete but with limitations due to poor positioning as patient with severe pushing to the right and restlessness making ability to fully the airway difficulty. Patient with deep penetration of tsp of honey thick liquid without response due to combination of delay in swallow initiation and decreased laryngeal closure. Unable to utilize compensatory strategies in attempts to prevent due to severity of aphasia. Full airway protection noted with approximately 4 ounces of pureed solid. Pharyngeal strength intact with full pharyngeal clearance post swallow. Recommend initiation of conservative diet (Dysphagia 1, pudding thick liquid) with close supervision during meals and close SLP f/u for tolerance. Repeat instrumental study recommended when patient better able to tolerate testing with either a MBS or FEES.  SLP Visit Diagnosis Dysphagia, oropharyngeal phase (R13.12);Aphasia (R47.01) Attention and concentration deficit following -- Frontal lobe and executive function deficit following -- Impact on safety and function Moderate aspiration risk   CHL IP TREATMENT RECOMMENDATION 09/27/2016 Treatment Recommendations Therapy as outlined in treatment plan below   Prognosis 09/27/2016 Prognosis for Safe Diet Advancement Good Barriers to Reach Goals Severity of deficits Barriers/Prognosis Comment -- CHL IP DIET RECOMMENDATION 09/27/2016 SLP Diet Recommendations Dysphagia 1 (Puree) solids;Pudding  thick liquid Liquid Administration via Spoon Medication Administration Crushed with puree Compensations Slow rate;Small sips/bites;Minimize environmental distractions Postural Changes Seated upright at 90 degrees   CHL IP OTHER RECOMMENDATIONS 09/27/2016 Recommended Consults -- Oral Care Recommendations Oral care BID Other Recommendations Order thickener from pharmacy;Prohibited food (jello, ice cream, thin soups);Remove water pitcher   CHL IP FOLLOW UP RECOMMENDATIONS 09/27/2016 Follow up Recommendations Inpatient Rehab   CHL IP FREQUENCY AND DURATION 09/27/2016 Speech Therapy Frequency (ACUTE ONLY) min 3x week Treatment Duration 2 weeks      CHL IP ORAL PHASE 09/27/2016 Oral Phase Impaired Oral - Pudding Teaspoon -- Oral - Pudding Cup -- Oral - Honey Teaspoon Decreased bolus cohesion;Lingual/palatal residue Oral - Honey Cup -- Oral - Nectar Teaspoon -- Oral - Nectar Cup -- Oral - Nectar Straw -- Oral - Thin Teaspoon -- Oral - Thin Cup -- Oral - Thin Straw -- Oral - Puree Lingual/palatal residue;Decreased bolus cohesion Oral - Mech Soft -- Oral - Regular -- Oral - Multi-Consistency -- Oral - Pill -- Oral Phase - Comment --  CHL IP PHARYNGEAL PHASE 09/27/2016 Pharyngeal Phase Impaired Pharyngeal- Pudding Teaspoon -- Pharyngeal -- Pharyngeal- Pudding Cup -- Pharyngeal -- Pharyngeal- Honey Teaspoon Delayed swallow initiation-pyriform sinuses;Penetration/Aspiration during swallow;Reduced airway/laryngeal closure Pharyngeal Material enters airway, CONTACTS cords and not  ejected out Pharyngeal- Honey Cup -- Pharyngeal -- Pharyngeal- Nectar Teaspoon -- Pharyngeal -- Pharyngeal- Nectar Cup -- Pharyngeal -- Pharyngeal- Nectar Straw -- Pharyngeal -- Pharyngeal- Thin Teaspoon -- Pharyngeal -- Pharyngeal- Thin Cup -- Pharyngeal -- Pharyngeal- Thin Straw -- Pharyngeal -- Pharyngeal- Puree Delayed swallow initiation-vallecula Pharyngeal -- Pharyngeal- Mechanical Soft -- Pharyngeal -- Pharyngeal- Regular -- Pharyngeal -- Pharyngeal-  Multi-consistency -- Pharyngeal -- Pharyngeal- Pill -- Pharyngeal -- Pharyngeal Comment --  Ferdinand Lango MA, CCC-SLP [phone] McCoy Leah Meryl 09/27/2016, 1:44 PM               Assessment/Plan: Diagnosis: large left fronto-parietal hematoma with dense right hemiparesis and global aphasia 1. Does the need for close, 24 hr/day medical supervision in concert with the patient's rehab needs make it unreasonable for this patient to be served in a less intensive setting? Yes 2. Co-Morbidities requiring supervision/potential complications: HTN, etoh abuse, post-stroke sequelae, dysphagia 3. Due to bladder management, bowel management, safety, skin/wound care, disease management, medication administration, pain management and patient education, does the patient require 24 hr/day rehab nursing? Yes 4. Does the patient require coordinated care of a physician, rehab nurse, PT (1-2 hrs/day, 5 days/week), OT (1-2 hrs/day, 5 days/week) and SLP (1-2 hrs/day, 5 days/week) to address physical and functional deficits in the context of the above medical diagnosis(es)? Yes Addressing deficits in the following areas: balance, endurance, locomotion, strength, transferring, bowel/bladder control, bathing, dressing, feeding, grooming, toileting, cognition, speech, language, swallowing and psychosocial support 5. Can the patient actively participate in an intensive therapy program of at least 3 hrs of therapy per day at least 5 days per week? Potentially 6. The potential for patient to make measurable gains while on inpatient rehab is good 7. Anticipated functional outcomes upon discharge from inpatient rehab are supervision and min assist  with PT, supervision and min assist with OT, supervision, min assist and mod assist with SLP. 8. Estimated rehab length of stay to reach the above functional goals is: 20-25 days 9. Anticipated D/C setting: Home 10. Anticipated post D/C treatments: HH therapy and Outpatient  therapy 11. Overall Rehab/Functional Prognosis: good  RECOMMENDATIONS: This patient's condition is appropriate for continued rehabilitative care in the following setting: CIR Patient has agreed to participate in recommended program. N/A Note that insurance prior authorization may be required for reimbursement for recommended care.  Comment: Rehab Admissions Coordinator to follow up.  Thanks,  Ranelle Oyster, MD, [place] [facility]    Charlton Amor., PA-C 09/28/2016

## 2016-09-28 NOTE — Progress Notes (Signed)
Patient passed swallow eval this morning.  Per RN, patient no longer in need of cortrak. Will complete consult.   Christophe Louis RD, LDN, CNSC Clinical Nutrition Pager: [phone] 09/28/2016 10:29 AM

## 2016-09-28 NOTE — Progress Notes (Signed)
  Speech Language Pathology Treatment: Dysphagia;Cognitive-Linquistic  Patient Details Name: [name] MRN: [id number] DOB: November 06, 1965 Today's Date: 09/28/2016 Time: 4098-1191 SLP Time Calculation (min) (ACUTE ONLY): 41 min  Assessment / Plan / Recommendation Clinical Impression  Worked extensively on aphasia with efforts to inhibit perseveration, produce two simple words with focus on accurate output/errorless production while inhibiting extraneous speech and transitioning between the two target words.  Initially, pt required max visual/auditory cues for successful production, by end of session pt able to shift with min cues and no modeling.  Pt exhibited tremendous effort and was stimulable for cueing and carry-over.  Tolerating dysphagia 1/pudding thick liquids, but required max verbal/tactile cues to slow down, inhibit impulsivity.  Poor sensation right face, requiring visual cues to attend to spillage.    Pt will need to have 1:1 assist with meals to ensure safety. Will continue to follow - anticipate good progress, especially with expression.   HPI HPI: 52 year old male with past medical history of hypertension,alcohol abuse not on medication presents aphasia and right hemiplegia due to  left thalamic hemorrhage with midline shift and vasogenic edema, likely related to hypertension.       SLP Plan  Continue with current plan of care       Recommendations  Diet recommendations: Dysphagia 1 (puree);Pudding-thick liquid Liquids provided via: Teaspoon Medication Administration: Crushed with puree Supervision: Staff to assist with self feeding Compensations: Slow rate;Small sips/bites;Minimize environmental distractions Postural Changes and/or Swallow Maneuvers: Seated upright 90 degrees                Oral Care Recommendations: Oral care BID Follow up Recommendations: Inpatient Rehab SLP Visit Diagnosis: Dysphagia, oropharyngeal phase (R13.12);Aphasia (R47.01) Plan:  Continue with current plan of care       GO                Carolan Shiver 09/28/2016, 11:31 AM

## 2016-09-28 NOTE — Evaluation (Signed)
Occupational Therapy Evaluation Patient Details Name: [name] MRN: [id number] DOB: [birth year] Today's Date: 09/28/2016    History of Present Illness  51 y.o. male with PMH of HTN, right basal ganglia infarct and alcohol abuse. Admitted with aphasia and right-sided weakness with left thalamic hemorrhage with midline shift   Clinical Impression   Pt currently requiring max assist +2 for squat pivot transfers. Able to wash face in sitting with min assist and cues to initiate/terminate task. Pt presenting with visual deficits, cognitive deficits, expressive aphasia, RUE weakness and sensation changes, and poor sitting/standing balance impacting his independence and safety with ADL and functional mobility. Recommending CIR level therapies to maximize independence and safety with ADL and functional mobility prior to return home. Pt would benefit from continued skilled OT to address established goals.    Follow Up Recommendations  CIR;Supervision/Assistance - 24 hour    Equipment Recommendations  Other (comment) (TBD at next venue)    Recommendations for Other Services       Precautions / Restrictions Precautions Precautions: Fall Precaution Comments: right neglect and hemiplegia Restrictions Weight Bearing Restrictions: No      Mobility Bed Mobility Overal bed mobility: Needs Assistance Bed Mobility: Supine to Sit     Supine to sit: Max assist;HOB elevated     General bed mobility comments: assist to move RLE to EOB, pt attempting to use LLE to move RLE but unable to accomplish. Assist to elevate trunk and fully pivot to EOB  Transfers Overall transfer level: Needs assistance Equipment used: 2 person hand held assist Transfers: Sit to/from [employer] Sit to Stand: Max assist;+2 physical assistance;+2 safety/equipment   Squat pivot transfers: Max assist;+2 physical assistance;+2 safety/equipment     General transfer comment: max assist with RLE  blocked, cues for sequence and assist to rise from bed. In standing pt pushing right with no quad activation and max assist for balance. In standing worked on weight shifting left with max assist. Pivot to chair with pt able to reach with LUE for surface but unable to pivot pelvis without max assist    Balance Overall balance assessment: Needs assistance   Sitting balance-Leahy Scale: Fair Sitting balance - Comments: initially min assist for balance but once in midline able to maintain without assist     Standing balance-Leahy Scale: Zero Standing balance comment: strong R lateral lean                           ADL either performed or assessed with clinical judgement   ADL Overall ADL's : Needs assistance/impaired     Grooming: Sitting;Wash/dry face;Minimal assistance Grooming Details (indicate cue type and reason): Cues for initiation and termination of task Upper Body Bathing: Maximal assistance;Sitting   Lower Body Bathing: Maximal assistance;Sit to/from stand   Upper Body Dressing : Maximal assistance;Sitting   Lower Body Dressing: Maximal assistance;Sit to/from stand   Toilet Transfer: Maximal assistance;+2 for physical assistance;Squat-pivot;BSC Toilet Transfer Details (indicate cue type and reason): simulated by transfer EOB>chair         Functional mobility during ADLs: Maximal assistance;+2 for physical assistance (for squat pivot)       Vision   Additional Comments: Difficult to assess due to impaired communication. Pt with L head prference but able to track past midline to R side with max verbal, auditory, and visual cues.      Perception     Praxis      Pertinent Vitals/Pain Pain  Assessment: Faces Faces Pain Scale: No hurt     Hand Dominance     Extremity/Trunk Assessment Upper Extremity Assessment Upper Extremity Assessment: RUE deficits/detail RUE Deficits / Details: no active movement noted, pt does not withdraw to pain RUE Sensation:  decreased light touch RUE Coordination: decreased fine motor;decreased gross motor   Lower Extremity Assessment Lower Extremity Assessment: Defer to PT evaluation RLE Deficits / Details: no AROM and no withdrawal to pain with nailbed pressure, PROM WFL   Cervical / Trunk Assessment Cervical / Trunk Assessment: Other exceptions Cervical / Trunk Exceptions: preference for left neck rotation and gaze   Communication Communication Communication: Expressive difficulties   Cognition Arousal/Alertness: Awake/alert Behavior During Therapy: Restless;Impulsive Overall Cognitive Status: Difficult to assess                                     General Comments  VSS throughout    Exercises     Shoulder Instructions      Home Living Family/patient expects to be discharged to:: Inpatient rehab Living Arrangements: Spouse/significant other Available Help at Discharge: Available PRN/intermittently Type of Home: House Home Access: Stairs to enter     Home Layout: Two level;Laundry or work area in basement;Able to live on main level with bedroom/bathroom     Bathroom Shower/Tub: Producer, television/film/video: Standard     Home Equipment: Information systems manager - built in;Walker - 2 wheels   Additional Comments: girlfriend works Radio broadcast assistant shifts      Prior Functioning/Environment Level of Independence: Independent        Comments: works in Therapist, music care, Armed forces training and education officer        OT Problem List: Decreased strength;Decreased range of motion;Decreased activity tolerance;Impaired balance (sitting and/or standing);Impaired vision/perception;Decreased cognition;Decreased coordination;Decreased safety awareness;Decreased knowledge of use of DME or AE;Decreased knowledge of precautions;Impaired sensation;Impaired tone;Impaired UE functional use      OT Treatment/Interventions: Self-care/ADL training;Therapeutic exercise;Neuromuscular education;Energy conservation;DME and/or  AE instruction;Therapeutic activities;Cognitive remediation/compensation;Visual/perceptual remediation/compensation;Patient/family education;Balance training    OT Goals(Current goals can be found in the care plan section) Acute Rehab OT Goals Patient Stated Goal: progress mobility OT Goal Formulation: With patient Time For Goal Achievement: 10/12/16 Potential to Achieve Goals: Good ADL Goals Pt Will Perform Grooming: with min guard assist;sitting Pt Will Transfer to Toilet: stand pivot transfer;bedside commode;with mod assist Additional ADL Goal #1: Pt will demonstrate sustained attention during ADL in a non distracting environment. Additional ADL Goal #2: Pt will follow one step command 50% of the time. Additional ADL Goal #3: Pt will attend to objects in R visual field 3/5 times with min cues.   OT Frequency: Min 3X/week   Barriers to D/C:            Co-evaluation PT/OT/SLP Co-Evaluation/Treatment: Yes Reason for Co-Treatment: Complexity of the patient's impairments (multi-system involvement);Necessary to address cognition/behavior during functional activity;For patient/therapist safety PT goals addressed during session: Mobility/safety with mobility;Balance OT goals addressed during session: ADL's and self-care      AM-PAC PT "6 Clicks" Daily Activity     Outcome Measure Help from another person eating meals?: Total Help from another person taking care of personal grooming?: A Little Help from another person toileting, which includes using toliet, bedpan, or urinal?: A Lot Help from another person bathing (including washing, rinsing, drying)?: A Lot Help from another person to put on and taking off regular upper body clothing?: A Lot Help from  another person to put on and taking off regular lower body clothing?: A Lot 6 Click Score: 12   End of Session Equipment Utilized During Treatment: Gait belt Nurse Communication: Mobility status  Activity Tolerance: Patient tolerated  treatment well Patient left: in chair;with call bell/phone within reach;with chair alarm set  OT Visit Diagnosis: Unsteadiness on feet (R26.81);Other abnormalities of gait and mobility (R26.89);Muscle weakness (generalized) (M62.81);Hemiplegia and hemiparesis                Time: 1610-9604 OT Time Calculation (min): 24 min Charges:  OT General Charges $OT Visit: 1 Visit OT Evaluation $OT Eval Moderate Complexity: 1 Mod G-Codes:     Zeniya Lapidus A. Brett Albino, M.S., OTR/L Pager: [phone]  Gaye Alken 09/28/2016, 11:59 AM

## 2016-09-29 DIAGNOSIS — I619 Nontraumatic intracerebral hemorrhage, unspecified: Secondary | ICD-10-CM

## 2016-09-29 LAB — BASIC METABOLIC PANEL
Anion gap: 6 (ref 5–15)
Anion gap: 7 (ref 5–15)
BUN: 14 mg/dL (ref 6–20)
BUN: 22 mg/dL — AB (ref 6–20)
CHLORIDE: 122 mmol/L — AB (ref 101–111)
CHLORIDE: 122 mmol/L — AB (ref 101–111)
CO2: 25 mmol/L (ref 22–32)
CO2: 26 mmol/L (ref 22–32)
CREATININE: 0.81 mg/dL (ref 0.61–1.24)
CREATININE: 1.06 mg/dL (ref 0.61–1.24)
Calcium: 8.5 mg/dL — ABNORMAL LOW (ref 8.9–10.3)
Calcium: 9.3 mg/dL (ref 8.9–10.3)
GFR calc Af Amer: >60 mL/min (ref >60)
GFR calc non Af Amer: >60 mL/min (ref >60)
GFR calc non Af Amer: >60 mL/min (ref >60)
Glucose, Bld: 123 mg/dL — ABNORMAL HIGH (ref 65–99)
Glucose, Bld: 157 mg/dL — ABNORMAL HIGH (ref 65–99)
Potassium: 3.1 mmol/L — ABNORMAL LOW (ref 3.5–5.1)
Potassium: 3 mmol/L — ABNORMAL LOW (ref 3.5–5.1)
SODIUM: 153 mmol/L — AB (ref 135–145)
SODIUM: 155 mmol/L — AB (ref 135–145)

## 2016-09-29 LAB — CBC
HCT: 47.9 % (ref 39.0–52.0)
Hemoglobin: 15.9 g/dL (ref 13.0–17.0)
MCH: 33.8 pg (ref 26.0–34.0)
MCHC: 33.2 g/dL (ref 30.0–36.0)
MCV: 101.7 fL — AB (ref 78.0–100.0)
PLATELETS: 173 10*3/uL (ref 150–400)
RBC: 4.71 MIL/uL (ref 4.22–5.81)
RDW: 12.5 % (ref 11.5–15.5)
WBC: 11.3 10*3/uL — AB (ref 4.0–10.5)

## 2016-09-29 LAB — SODIUM
Sodium: 155 mmol/L — ABNORMAL HIGH (ref 135–145)
Sodium: 154 mmol/L — ABNORMAL HIGH (ref 135–145)

## 2016-09-29 LAB — GLUCOSE, CAPILLARY: Glucose-Capillary: 113 mg/dL — ABNORMAL HIGH (ref 65–99)

## 2016-09-29 MED ORDER — CLONIDINE HCL 0.1 MG PO TABS
0.1000 mg | ORAL_TABLET | Freq: Two times a day (BID) | ORAL | Status: DC
  Administered 2016-09-29 (×2): 0.1 mg via ORAL
  Filled 2016-09-29 (×2): qty 1

## 2016-09-29 NOTE — Progress Notes (Signed)
STROKE TEAM PROGRESS NOTE   SUBJECTIVE (INTERVAL HISTORY) His RN is at the bedside.  Still on 3% Na and cleviprex. Globally aphasic  OBJECTIVE Temp:  [97.9 F (36.6 C)-98.7 F (37.1 C)] 98.6 F (37 C) (09/29 0300) Pulse Rate:  [50-74] 54 (09/29 0730) Cardiac Rhythm: Sinus bradycardia (09/29 0745) Resp:  [12-24] 20 (09/29 0730) BP: (111-150)/(71-93) 118/73 (09/29 0730) SpO2:  [90 %-99 %] 93 % (09/29 0730)  CBC:   Recent Labs Lab 09/24/16 2029  09/28/16 0403 09/29/16 0428  WBC 9.8  < > 13.8* 11.3*  NEUTROABS 5.5  --   --   --   HGB 16.3  < > 15.0 15.9  HCT 46.7  < > 46.2 47.9  MCV 94.7  < > 102.4* 101.7*  PLT 187  < > 146* 173  < > = values in this interval not displayed.  Basic Metabolic Panel:   Recent Labs Lab 09/28/16 0403  09/29/16 0034 09/29/16 0428  NA 152*  < > 155* 153*  K 3.2*  --   --  3.1*  CL 120*  --   --  122*  CO2 26  --   --  25  GLUCOSE 129*  --   --  123*  BUN 12  --   --  14  CREATININE 0.80  --   --  0.81  CALCIUM 8.4*  --   --  8.5*  < > = values in this interval not displayed.  Lipid Panel:     Component Value Date/Time   CHOL 210 (H) 09/26/2016 0407   TRIG 153 (H) 09/26/2016 0407   HDL 55 09/26/2016 0407   CHOLHDL 3.8 09/26/2016 0407   VLDL 31 09/26/2016 0407   LDLCALC 124 (H) 09/26/2016 0407   HgbA1c:  Lab Results  Component Value Date   HGBA1C 5.8 (H) 09/26/2016   Urine Drug Screen:     Component Value Date/Time   LABOPIA NONE DETECTED 09/24/2016 2127   COCAINSCRNUR NONE DETECTED 09/24/2016 2127   LABBENZ NONE DETECTED 09/24/2016 2127   AMPHETMU NONE DETECTED 09/24/2016 2127   THCU NONE DETECTED 09/24/2016 2127   LABBARB NONE DETECTED 09/24/2016 2127    Alcohol Level     Component Value Date/Time   ETH <5 09/24/2016 2351    IMAGING I have personally reviewed the radiological images below and agree with the radiology interpretations.  Ct Angio Head W Or Wo Contrast Ct Head Code Stroke Wo Contrast   09/24/2016 IMPRESSION: CT HEAD: 1. Large LEFT thalamus and surrounding structure intraparenchymal hematoma. 3 mm LEFT-to-RIGHT midline shift without ventricular entrapment or hydrocephalus. 2. Old RIGHT basal ganglia lacunar infarct. CTA HEAD: 1. Angiographic spot sign central within LEFT hematoma consistent with active hemorrhage. 2. No emergent large vessel occlusion. 3. Severe stenosis LEFT P2 segment most compatible with atherosclerosis. Moderate stenosis anterior circulation, likely due to atherosclerosis though nonspecific in the presence of hemorrhage.   CT Head Code Stroke 09/24/2016 IMPRESSION: 3.6 x 5.9 x 4.6 cm (volume = 51 cm^3) dense LEFT thalamus, Basal ganglia and external capsule hematoma. 3 mm LEFT-to-RIGHT midline shift. No ventricular entrapment or hydrocephalus. No acute large vascular territory infarct. Old RIGHT basal ganglia lacunar infarct. No abnormal extra-axial fluid collections. Basal cisterns are patent. VASCULAR: Moderate calcific atherosclerosis carotid siphons.  CT Head 09/24/2016 IMPRESSION: 1. Increased size of the intraparenchymal hematoma centered in the left thalamus and basal ganglia, which now has a calculated volume of 59 cc, previously 51 cc. This measurement may underestimate  the degree of increase, as there are new irregular extensions arising from the margins of the primary hematoma. No new, remote hemorrhagic foci. 2. Unchanged 4 mm rightward midline shift with mass effect on the ventricles. Basal cisterns remain patent. 3. No intraventricular hemorrhagic extension or hydrocephalus. No ventricular trapping.   CT Head 09/25/2016 1. Enlarging 78 cc hematoma epicenter LEFT deep gray nuclei, increased from 59 cc. New satellite 4 mm hemorrhage. 2. Worsening 7 mm LEFT-to-RIGHT midline shift. No ventricular entrapment. 3. Old RIGHT basal ganglia lacunar infarct.  TTE 09/25/2016 Study Conclusions - Left ventricle: The cavity size was normal. Systolic function  was normal. The estimated ejection fraction was in the range of 55% to 60%. Wall motion was normal; there were no regional wall motion abnormalities. Doppler parameters are consistent with abnormal left ventricular relaxation (grade 1 diastolic dysfunction). - Mitral valve: Calcified annulus. Impressions: - Normal LV systolic function; mild diastolic dysfunction.  CT head 09/28/16 Large hematoma left frontal parietal operculum unchanged. Increased low-density edema surrounding the hematoma. 9 mm midline shift to the right unchanged.   PHYSICAL EXAM  Temp:  [97.9 F (36.6 C)-98.7 F (37.1 C)] 98.6 F (37 C) (09/29 0300) Pulse Rate:  [50-74] 54 (09/29 0730) Resp:  [12-24] 20 (09/29 0730) BP: (111-150)/(71-93) 118/73 (09/29 0730) SpO2:  [90 %-99 %] 93 % (09/29 0730)  General - Well nourished, well developed, alert but globally aphasic  Ophthalmologic - Fundi not visualized due to noncooperation.  Cardiovascular - Regular rate and rhythm.  Neuro - alert , left-sided forced gaze, resident neglect, expressive and receptive aphasia, no speech output, able to follow limited simple commands with visual prompts  including close and open eyes, however not following peripheral commands. Not blinking to visual threat on the right. PERRL. right facial droop, tongue midline. Right hemiplegic, 0/5, left upper and lower extremity spontaneous movement against gravity. Right Babinski positive. Sensation, coordination not cooperative and gait not tested.    ASSESSMENT/PLAN Mr. CHRSTOPHER MALENFANT is a 51 y.o. male with history of hypertension and alcohol abuse presenting with aphasia and right-sided weakness.  He did not receive IV t-PA due to ICH.   ICH: Large L thalamic ICH, secondary to untreated hypertensive crisis.  Resultant  left gaze, right neglect, expressive aphasia, right hemiplegia  CT head:  3.6 x 5.9 x 4.6 cm (volume = 51 cm^3) dense LEFT thalamus, basal ganglia, and external capsule  hematoma with 3 mm LEFT-to-RIGHT midline shift.  Repeat CT x 2 on 09/25/16 gradual enlargement of hematoma to 78cc with increased midline shift  CT repeat 09/26/16 hematoma 81cc with increased midline shift  CT repeat stable hematoma and slight increase of edema and midline shift  2D Echo: EF 55-60%. No source of embolus  LDL 124  HgbA1c 5.8  SCDs for VTE prophylaxis DIET - DYS 1 Room service appropriate? Yes; Fluid consistency: Pudding Thick  No antithrombotic prior to admission, now on No antithrombotic  Ongoing aggressive stroke risk factor management  Therapy recommendations:  pending  Disposition:  Pending  Cerebral edema  Repeat CT showed gradual enlargement of hematoma with increased midline shift  CT repeat 09/28/16 stable hematoma and slight increase of edema and midline shift  On 3% saline  23.4% saline PRN for Na goal 150-160  Sodium 136-140-143-146-152  Stable mental status  Discussed with neurosurgery Dr. Lovell Sheehan, no role of surgical intervention at this time  Hypertensive emergency  BP high on admission  BP goal < 140  Still on cleviprex  off labetalol IV On novasc and lisinopril Increase lisinopril dose Added Clonidine  Alcohol abuse  4-18 beers daily  On CIWA protocol  Precedex as needed  B1/FA/MVI  Hyperlipidemia  Home meds: none  LDL 124, goal < 70  No need to initiate statin at acute ICH phase.   Dysphagia   pass swallow  On dys 1 and pudding thick liquid  Close supervision  Other Stroke Risk Factors  Obesity, Body mass index is 32.53 kg/m., recommend weight loss, diet and exercise as appropriate   Other Active Problems  Hyperglycemia  Leukocytosis 11.3-14.3->13.3->13.8, afebrile  Hospital day # 5  This patient is critically ill due to large thalamic ICH, cerebral edema, alcohol abuse, hypertensive emergency and at significant risk of neurological worsening, death form hematoma expansion, brain herniation,  seizure, status epilepticus. This patient's care requires constant monitoring of vital signs, hemodynamics, respiratory and cardiac monitoring, review of multiple databases, neurological assessment, discussion with family, other specialists and medical decision making of high complexity. I spent 35 minutes of neurocritical care time in the care of this patient.    To contact Stroke Continuity provider, please refer to [email or web address]. After hours, contact General Neurology

## 2016-09-30 ENCOUNTER — Inpatient Hospital Stay (HOSPITAL_COMMUNITY): Payer: BLUE CROSS/BLUE SHIELD

## 2016-09-30 LAB — SODIUM
SODIUM: 159 mmol/L — AB (ref 135–145)
Sodium: 157 mmol/L — ABNORMAL HIGH (ref 135–145)

## 2016-09-30 LAB — CBC
HEMATOCRIT: 48.7 % (ref 39.0–52.0)
Hemoglobin: 15.6 g/dL (ref 13.0–17.0)
MCH: 33.2 pg (ref 26.0–34.0)
MCHC: 32 g/dL (ref 30.0–36.0)
MCV: 103.6 fL — ABNORMAL HIGH (ref 78.0–100.0)
Platelets: 170 10*3/uL (ref 150–400)
RBC: 4.7 MIL/uL (ref 4.22–5.81)
RDW: 12.7 % (ref 11.5–15.5)
WBC: 9.5 10*3/uL (ref 4.0–10.5)

## 2016-09-30 LAB — BASIC METABOLIC PANEL
ANION GAP: 6 (ref 5–15)
BUN: 28 mg/dL — ABNORMAL HIGH (ref 6–20)
CALCIUM: 9.1 mg/dL (ref 8.9–10.3)
CO2: 30 mmol/L (ref 22–32)
Chloride: 123 mmol/L — ABNORMAL HIGH (ref 101–111)
Creatinine, Ser: 1.06 mg/dL (ref 0.61–1.24)
GLUCOSE: 120 mg/dL — AB (ref 65–99)
POTASSIUM: 2.9 mmol/L — AB (ref 3.5–5.1)
Sodium: 159 mmol/L — ABNORMAL HIGH (ref 135–145)

## 2016-09-30 MED ORDER — POTASSIUM CHLORIDE 20 MEQ PO PACK
20.0000 meq | PACK | Freq: Three times a day (TID) | ORAL | Status: AC
  Administered 2016-09-30 – 2016-10-01 (×4): 20 meq via ORAL
  Filled 2016-09-30 (×4): qty 1

## 2016-09-30 MED ORDER — CLONIDINE HCL 0.1 MG PO TABS
0.2000 mg | ORAL_TABLET | Freq: Two times a day (BID) | ORAL | Status: DC
  Administered 2016-09-30 – 2016-10-06 (×7): 0.2 mg via ORAL
  Filled 2016-09-30 (×8): qty 2

## 2016-09-30 NOTE — Progress Notes (Signed)
STROKE TEAM PROGRESS NOTE   SUBJECTIVE (INTERVAL HISTORY) His RN is at the bedside.  Stopped 3% yesterday. He has been given Ativan forpresumed etoh withdrawal symptoms. Repeat CT head today stable.   OBJECTIVE Temp:  [98.5 F (36.9 C)-99.5 F (37.5 C)] 98.5 F (36.9 C) (09/30 0403) Pulse Rate:  [56-89] 56 (09/30 0300) Cardiac Rhythm: Normal sinus rhythm (09/29 2000) Resp:  [14-25] 19 (09/30 0300) BP: (133-169)/(77-113) 169/113 (09/30 0530) SpO2:  [84 %-100 %] 95 % (09/30 0300)  CBC:   Recent Labs Lab 09/24/16 2029  09/29/16 0428 09/30/16 0115  WBC 9.8  < > 11.3* 9.5  NEUTROABS 5.5  --   --   --   HGB 16.3  < > 15.9 15.6  HCT 46.7  < > 47.9 48.7  MCV 94.7  < > 101.7* 103.6*  PLT 187  < > 173 170  < > = values in this interval not displayed.  Basic Metabolic Panel:   Recent Labs Lab 09/29/16 1848 09/30/16 0115  NA 155* 159*  159*  K 3.0* 2.9*  CL 122* 123*  CO2 26 30  GLUCOSE 157* 120*  BUN 22* 28*  CREATININE 1.06 1.06  CALCIUM 9.3 9.1    Lipid Panel:     Component Value Date/Time   CHOL 210 (H) 09/26/2016 0407   TRIG 153 (H) 09/26/2016 0407   HDL 55 09/26/2016 0407   CHOLHDL 3.8 09/26/2016 0407   VLDL 31 09/26/2016 0407   LDLCALC 124 (H) 09/26/2016 0407   HgbA1c:  Lab Results  Component Value Date   HGBA1C 5.8 (H) 09/26/2016   Urine Drug Screen:     Component Value Date/Time   LABOPIA NONE DETECTED 09/24/2016 2127   COCAINSCRNUR NONE DETECTED 09/24/2016 2127   LABBENZ NONE DETECTED 09/24/2016 2127   AMPHETMU NONE DETECTED 09/24/2016 2127   THCU NONE DETECTED 09/24/2016 2127   LABBARB NONE DETECTED 09/24/2016 2127    Alcohol Level     Component Value Date/Time   ETH <5 09/24/2016 2351    IMAGING I have personally reviewed the radiological images below and agree with the radiology interpretations.  Ct Angio Head W Or Wo Contrast Ct Head Code Stroke Wo Contrast  09/24/2016 IMPRESSION: CT HEAD: 1. Large LEFT thalamus and surrounding  structure intraparenchymal hematoma. 3 mm LEFT-to-RIGHT midline shift without ventricular entrapment or hydrocephalus. 2. Old RIGHT basal ganglia lacunar infarct. CTA HEAD: 1. Angiographic spot sign central within LEFT hematoma consistent with active hemorrhage. 2. No emergent large vessel occlusion. 3. Severe stenosis LEFT P2 segment most compatible with atherosclerosis. Moderate stenosis anterior circulation, likely due to atherosclerosis though nonspecific in the presence of hemorrhage.   CT Head Code Stroke 09/24/2016 IMPRESSION: 3.6 x 5.9 x 4.6 cm (volume = 51 cm^3) dense LEFT thalamus, Basal ganglia and external capsule hematoma. 3 mm LEFT-to-RIGHT midline shift. No ventricular entrapment or hydrocephalus. No acute large vascular territory infarct. Old RIGHT basal ganglia lacunar infarct. No abnormal extra-axial fluid collections. Basal cisterns are patent. VASCULAR: Moderate calcific atherosclerosis carotid siphons.  CT Head 09/24/2016 IMPRESSION: 1. Increased size of the intraparenchymal hematoma centered in the left thalamus and basal ganglia, which now has a calculated volume of 59 cc, previously 51 cc. This measurement may underestimate the degree of increase, as there are new irregular extensions arising from the margins of the primary hematoma. No new, remote hemorrhagic foci. 2. Unchanged 4 mm rightward midline shift with mass effect on the ventricles. Basal cisterns remain patent. 3. No intraventricular hemorrhagic  extension or hydrocephalus. No ventricular trapping.   CT Head 09/25/2016 1. Enlarging 78 cc hematoma epicenter LEFT deep gray nuclei, increased from 59 cc. New satellite 4 mm hemorrhage. 2. Worsening 7 mm LEFT-to-RIGHT midline shift. No ventricular entrapment. 3. Old RIGHT basal ganglia lacunar infarct.  TTE 09/25/2016 Study Conclusions - Left ventricle: The cavity size was normal. Systolic function was normal. The estimated ejection fraction was in the range of 55% to 60%.  Wall motion was normal; there were no regional wall motion abnormalities. Doppler parameters are consistent with abnormal left ventricular relaxation (grade 1 diastolic dysfunction). - Mitral valve: Calcified annulus. Impressions: - Normal LV systolic function; mild diastolic dysfunction.  CT head 09/28/16 Large hematoma left frontal parietal operculum unchanged. Increased low-density edema surrounding the hematoma. 9 mm midline shift to the right unchanged.   PHYSICAL EXAM  Temp:  [98.5 F (36.9 C)-99.5 F (37.5 C)] 98.5 F (36.9 C) (09/30 0403) Pulse Rate:  [56-89] 56 (09/30 0300) Resp:  [14-25] 19 (09/30 0300) BP: (133-169)/(77-113) 169/113 (09/30 0530) SpO2:  [84 %-100 %] 95 % (09/30 0300)   Vitals:   09/30/16 0530 09/30/16 0600 09/30/16 0700 09/30/16 0800  BP: (!) 169/113 (!) 178/109 (!) 155/106 (!) 183/121  Pulse:  81 72 81  Resp:  (!) 23 (!) 25 (!) 21  Temp:    98 F (36.7 C)  TempSrc:    Oral  SpO2:  96% (!) 86% 96%  Weight:      Height:          General - Well nourished, well developed, alert but globally aphasic  Ophthalmologic - Fundi not visualized due to noncooperation.  Cardiovascular - Regular rate and rhythm.  Neuro - alert , right gaze, resident neglect, expressive and receptive aphasia, no speech output, able to follow limited simple commands with visual prompts  including close and open eyes, however not following peripheral commands. Not blinking to visual threat on the right. PERRL. right facial droop, tongue midline. Right hemiplegic, 0/5, left upper and lower extremity spontaneous movement against gravity. Right Babinski positive. Sensation, coordination not cooperative and gait not tested.    ASSESSMENT/PLAN Mr. HAMMOND OBEIRNE is a 51 y.o. male with history of hypertension and alcohol abuse presenting with aphasia and right-sided weakness.  He did not receive IV t-PA due to ICH.   ICH: Large L thalamic ICH, secondary to untreated hypertensive  crisis.  Resultant  left gaze, right neglect, expressive aphasia, right hemiplegia  CT head:  3.6 x 5.9 x 4.6 cm (volume = 51 cm^3) dense LEFT thalamus, basal ganglia, and external capsule hematoma with 3 mm LEFT-to-RIGHT midline shift.  Repeat CT x 2 on 09/25/16 gradual enlargement of hematoma to 78cc with increased midline shift  CT repeat 09/26/16 hematoma 81cc with increased midline shift  CT repeat stable hematoma and slight increase of edema and midline shift  2D Echo: EF 55-60%. No source of embolus  LDL 124  HgbA1c 5.8  SCDs for VTE prophylaxis DIET - DYS 1 Room service appropriate? Yes; Fluid consistency: Pudding Thick  No antithrombotic prior to admission, now on No antithrombotic  Ongoing aggressive stroke risk factor management  Therapy recommendations:  pending  Disposition:  Pending  Cerebral edema  Repeat CT showed gradual enlargement of hematoma with increased midline shift  CT repeat 09/28/16 stable hematoma and slight increase of edema and midline shift  On 3% saline  23.4% saline PRN for Na goal 150-160  Sodium 136-140-143-146-152  Stable mental status  Discussed  with neurosurgery Dr. Lovell Sheehan, no role of surgical intervention at this time  Hypertensive emergency  BP high on admission  BP goal < 140  Still on cleviprex   off labetalol IV On novasc and lisinopril Increase lisinopril dose Added Clonidine  Alcohol abuse  4-18 beers daily  On CIWA protocol  Precedex as needed  B1/FA/MVI  Hyperlipidemia  Home meds: none  LDL 124, goal < 70  No need to initiate statin at acute ICH phase.   Dysphagia   pass swallow  On dys 1 and pudding thick liquid  Close supervision  Other Stroke Risk Factors  Obesity, Body mass index is 32.53 kg/m., recommend weight loss, diet and exercise as appropriate   Other Active Problems  Hyperglycemia  Leukocytosis 11.3-14.3->13.3->13.8, afebrile  Hospital day # 6  This patient is  critically ill due to large thalamic ICH, cerebral edema, alcohol abuse, hypertensive emergency and at significant risk of neurological worsening, death form hematoma expansion, brain herniation, seizure, status epilepticus. This patient's care requires constant monitoring of vital signs, hemodynamics, respiratory and cardiac monitoring, review of multiple databases, neurological assessment, discussion with family, other specialists and medical decision making of high complexity. I spent 35 minutes of neurocritical care time in the care of this patient.    To contact Stroke Continuity provider, please refer to [email or web address]. After hours, contact General Neurology

## 2016-10-01 ENCOUNTER — Inpatient Hospital Stay (HOSPITAL_COMMUNITY): Payer: BLUE CROSS/BLUE SHIELD

## 2016-10-01 DIAGNOSIS — G919 Hydrocephalus, unspecified: Secondary | ICD-10-CM

## 2016-10-01 DIAGNOSIS — I610 Nontraumatic intracerebral hemorrhage in hemisphere, subcortical: Secondary | ICD-10-CM

## 2016-10-01 DIAGNOSIS — G936 Cerebral edema: Secondary | ICD-10-CM

## 2016-10-01 DIAGNOSIS — G935 Compression of brain: Secondary | ICD-10-CM

## 2016-10-01 LAB — GLUCOSE, CAPILLARY: GLUCOSE-CAPILLARY: 130 mg/dL — AB (ref 65–99)

## 2016-10-01 LAB — BASIC METABOLIC PANEL
ANION GAP: 4 — AB (ref 5–15)
Anion gap: 5 (ref 5–15)
BUN: 29 mg/dL — ABNORMAL HIGH (ref 6–20)
BUN: 29 mg/dL — AB (ref 6–20)
CALCIUM: 8.9 mg/dL (ref 8.9–10.3)
CHLORIDE: 121 mmol/L — AB (ref 101–111)
CHLORIDE: 123 mmol/L — AB (ref 101–111)
CO2: 29 mmol/L (ref 22–32)
CO2: 29 mmol/L (ref 22–32)
CREATININE: 1.07 mg/dL (ref 0.61–1.24)
Calcium: 9 mg/dL (ref 8.9–10.3)
Creatinine, Ser: 1.04 mg/dL (ref 0.61–1.24)
GFR calc Af Amer: >60 mL/min (ref >60)
GFR calc Af Amer: >60 mL/min (ref >60)
GFR calc non Af Amer: >60 mL/min (ref >60)
GFR calc non Af Amer: >60 mL/min (ref >60)
GLUCOSE: 126 mg/dL — AB (ref 65–99)
GLUCOSE: 127 mg/dL — AB (ref 65–99)
POTASSIUM: 3.5 mmol/L (ref 3.5–5.1)
Potassium: 3.6 mmol/L (ref 3.5–5.1)
Sodium: 156 mmol/L — ABNORMAL HIGH (ref 135–145)
Sodium: 155 mmol/L — ABNORMAL HIGH (ref 135–145)

## 2016-10-01 LAB — MAGNESIUM: MAGNESIUM: 2.4 mg/dL (ref 1.7–2.4)

## 2016-10-01 LAB — CBC
HEMATOCRIT: 51.2 % (ref 39.0–52.0)
HEMOGLOBIN: 16.7 g/dL (ref 13.0–17.0)
MCH: 33.7 pg (ref 26.0–34.0)
MCHC: 32.6 g/dL (ref 30.0–36.0)
MCV: 103.4 fL — AB (ref 78.0–100.0)
Platelets: 155 10*3/uL (ref 150–400)
RBC: 4.95 MIL/uL (ref 4.22–5.81)
RDW: 12.5 % (ref 11.5–15.5)
WBC: 11.7 10*3/uL — ABNORMAL HIGH (ref 4.0–10.5)

## 2016-10-01 LAB — SODIUM
SODIUM: 156 mmol/L — AB (ref 135–145)
Sodium: 157 mmol/L — ABNORMAL HIGH (ref 135–145)

## 2016-10-01 MED ORDER — SODIUM CHLORIDE 3 % IV SOLN
INTRAVENOUS | Status: DC
  Filled 2016-10-01 (×2): qty 500

## 2016-10-01 MED ORDER — MORPHINE SULFATE (PF) 4 MG/ML IV SOLN
5.0000 mg | Freq: Once | INTRAVENOUS | Status: AC
  Administered 2016-10-01: 5 mg via INTRAVENOUS

## 2016-10-01 MED ORDER — SODIUM CHLORIDE 3 % IV SOLN: INTRAVENOUS | Status: DC

## 2016-10-01 MED ORDER — SODIUM CHLORIDE 3 % IV SOLN
50.0000 mL/h | INTRAVENOUS | Status: DC
  Administered 2016-10-03 (×2): 50 mL/h via INTRAVENOUS
  Filled 2016-10-01 (×13): qty 500

## 2016-10-01 MED ORDER — MIDAZOLAM HCL 2 MG/2ML IJ SOLN
INTRAMUSCULAR | Status: AC
  Administered 2016-10-01: 2 mg
  Filled 2016-10-01: qty 2

## 2016-10-01 MED ORDER — MORPHINE SULFATE (PF) 4 MG/ML IV SOLN
INTRAVENOUS | Status: AC
  Filled 2016-10-01: qty 2

## 2016-10-01 MED ORDER — MIDAZOLAM HCL 2 MG/2ML IJ SOLN
2.0000 mg | Freq: Once | INTRAMUSCULAR | Status: AC
  Administered 2016-10-01: 2 mg via INTRAVENOUS

## 2016-10-01 MED ORDER — SODIUM CHLORIDE 0.9 % IV SOLN
INTRAVENOUS | Status: DC
  Administered 2016-10-01: 10:00:00 via INTRAVENOUS

## 2016-10-01 NOTE — Progress Notes (Signed)
OT Cancellation Note  Patient Details Name: AMADEO COKE MRN: [id number] DOB: [birth year]   Cancelled Treatment:    Reason Eval/Treat Not Completed: Medical issues which prohibited therapy. RN reports pt less responsive and no appropriate for OT at this time.  Evette Georges [phone]  10/01/2016, 2:20 PM

## 2016-10-01 NOTE — Progress Notes (Signed)
Physical Therapy Treatment Patient Details Name: [name] MRN: [id number] DOB: 1965-12-12 Today's Date: 10/01/2016    History of Present Illness  51 y.o. male with PMH of HTN, right basal ganglia infarct and alcohol abuse. Admitted with aphasia and right-sided weakness with left thalamic hemorrhage with midline shift    PT Comments    Pt with increased lethargy and restlessness this date. Pt with no command follow and was non-verbal. Pt con't with R sided neglect and strong L pusher syndrome. Unable to safely get pt to the chair today due to resistance and impulsivity. Pt remains appropriate for CIR. Acute PT to con't to follow.    Follow Up Recommendations  CIR;Supervision/Assistance - 24 hour     Equipment Recommendations  3in1 (PT)    Recommendations for Other Services       Precautions / Restrictions Precautions Precautions: Fall Precaution Comments: R sided neglect and hemiplegia, restless Restrictions Weight Bearing Restrictions: No    Mobility  Bed Mobility Overal bed mobility: Needs Assistance Bed Mobility: Supine to Sit;Sit to Supine     Supine to sit: Max assist;+2 for physical assistance;HOB elevated Sit to supine: Max assist;+2 for physical assistance   General bed mobility comments: pt with no command follow, pt with no initiation of R UE or LE, maxA for trunk elevation and to bring LEs in/out of bed  Transfers Overall transfer level: Needs assistance Equipment used:  (2 person lift with gait belt and bed pad) Transfers: Sit to/from Stand Sit to Stand: Max assist;+2 physical assistance;+2 safety/equipment   Squat pivot transfers:  (unable)     General transfer comment: pt completed 2 sit to stand transfers however had an extremely strong push from L UE/LE despite max verbal and tactile cues. pt unable to pivot to the L or the R, attempted x2.   Ambulation/Gait             General Gait Details: unable at this time   Stairs             Wheelchair Mobility    Modified Rankin (Stroke Patients Only) Modified Rankin (Stroke Patients Only) Pre-Morbid Rankin Score: No symptoms Modified Rankin: Severe disability     Balance Overall balance assessment: Needs assistance Sitting-balance support: Feet supported;Single extremity supported Sitting balance-Leahy Scale: Poor Sitting balance - Comments: pt varied from dependent to minA. pt with strong lean to the R due to pushing on the L. when pt's L hand is placed in lap pt able to maintain midline position x 30 sec before falling to the R.     Standing balance-Leahy Scale: Zero Standing balance comment: strong R lateral lean                            Cognition Arousal/Alertness: Lethargic Behavior During Therapy: Restless;Impulsive Overall Cognitive Status: Impaired/Different from baseline Area of Impairment: Orientation;Attention;Memory;Following commands;Safety/judgement;Awareness;Problem solving                 Orientation Level: Disoriented to;Place;Time;Situation Current Attention Level: Focused Memory: Decreased recall of precautions;Decreased short-term memory Following Commands:  (no command follow) Safety/Judgement: Decreased awareness of safety;Decreased awareness of deficits (R sided neglect) Awareness: Intellectual Problem Solving: Slow processing;Decreased initiation;Difficulty sequencing;Requires verbal cues;Requires tactile cues General Comments: pt restless pulling at covers      Exercises      General Comments        Pertinent Vitals/Pain Pain Assessment: Faces Faces Pain Scale: No hurt  Home Living                      Prior Function            PT Goals (current goals can now be found in the care plan section) Progress towards PT goals: Not progressing toward goals - comment (limited by lethargy this date)    Frequency    Min 4X/week      PT Plan Current plan remains appropriate     Co-evaluation              AM-PAC PT "6 Clicks" Daily Activity  Outcome Measure  Difficulty turning over in bed (including adjusting bedclothes, sheets and blankets)?: Unable Difficulty moving from lying on back to sitting on the side of the bed? : Unable Difficulty sitting down on and standing up from a chair with arms (e.g., wheelchair, bedside commode, etc,.)?: Unable Help needed moving to and from a bed to chair (including a wheelchair)?: Total Help needed walking in hospital room?: Total Help needed climbing 3-5 steps with a railing? : Total 6 Click Score: 6    End of Session Equipment Utilized During Treatment: Gait belt Activity Tolerance: Patient limited by lethargy Patient left: in bed;with call bell/phone within reach;with family/visitor present Nurse Communication: Mobility status;Precautions PT Visit Diagnosis: Hemiplegia and hemiparesis;Other symptoms and signs involving the nervous system (R29.898);Other abnormalities of gait and mobility (R26.89) Hemiplegia - Right/Left: Right Hemiplegia - dominant/non-dominant: Dominant Hemiplegia - caused by: Other Nontraumatic intracranial hemorrhage     Time: 9604-5409 PT Time Calculation (min) (ACUTE ONLY): 26 min  Charges:  $Therapeutic Activity: 8-22 mins $Neuromuscular Re-education: 8-22 mins                    G Codes:       Lewis Shock, PT, DPT Pager #: [phone] Office #: [phone]    Kem Hensen M Ilina Xu 10/01/2016, 12:00 PM

## 2016-10-01 NOTE — Progress Notes (Signed)
SLP Cancellation Note  Patient Details Name: [name] MRN: [id number] DOB: 21-Jul-1965   Cancelled treatment:       Reason Eval/Treat Not Completed: Patient's level of consciousness;Fatigue/lethargy limiting ability to participate.  With RN present, attempted to wake patient for treatment without success. Patient moving left upper extremity however despite sternal rub, loud voice, patient unable to arouse. Will re-attempt 10/2.   Ferdinand Lango MA, CCC-SLP [phone]    [name] 10/01/2016, 12:19 PM

## 2016-10-01 NOTE — Progress Notes (Signed)
Inpatient Rehabilitation  Note change in medical status today.  Met with patient's family at bedside to discuss team's recommendation for IP Rehab.  Shared insurance verification letter and answered initial questions.  Plan to continue to follow for timing of medical readiness, insurance authorization, and IP Rehab bed availability.  Please call with questions.   Carmelia Roller., CCC/SLP Admission Coordinator  [place]  Cell [phone]

## 2016-10-01 NOTE — Progress Notes (Signed)
Dr. Pearlean Brownie notified that patient had increased lethargy and that BP had dropped significantly after daily BP medications. Order received for 500 ml bolus and stat CT.

## 2016-10-01 NOTE — Consult Note (Signed)
Reason for Consult:possible obstructive hydrocephalus Referring Physician: [name], [name] is an 51 y.o. male.  HPI: admitted on 9/24 with a large basal ganglia hemorrhage on the left side, minimal shift initially. Since admission there has been expected evolution of the hemorrhage, and increasing left to right shift. Scan today suggested ventricular trapping, though there is minimal ventriculomegaly. I have been asked to place a ventricular catheter to drain the csf.   Past Medical History:  Diagnosis Date  . Hypertension     No past surgical history on file.  No family history on file.  Social History:  reports that he has never smoked. He has never used smokeless tobacco. He reports that he drinks alcohol. His drug history is not on file.  Allergies: No Known Allergies  Medications: I have reviewed the patient's current medications.  Results for orders placed or performed during the hospital encounter of 09/24/16 (from the past 48 hour(s))  Sodium     Status: Abnormal   Collection Time: 09/30/16  1:15 AM  Result Value Ref Range   Sodium 159 (H) 135 - 145 mmol/L  Basic metabolic panel     Status: Abnormal   Collection Time: 09/30/16  1:15 AM  Result Value Ref Range   Sodium 159 (H) 135 - 145 mmol/L   Potassium 2.9 (L) 3.5 - 5.1 mmol/L   Chloride 123 (H) 101 - 111 mmol/L   CO2 30 22 - 32 mmol/L   Glucose, Bld 120 (H) 65 - 99 mg/dL   BUN 28 (H) 6 - 20 mg/dL   Creatinine, Ser 1.06 0.61 - 1.24 mg/dL   Calcium 9.1 8.9 - 10.3 mg/dL   GFR calc non Af Amer >60 >60 mL/min   GFR calc Af Amer >60 >60 mL/min    Comment: (NOTE) The eGFR has been calculated using the CKD EPI equation. This calculation has not been validated in all clinical situations. eGFR's persistently <60 mL/min signify possible Chronic Kidney Disease.    Anion gap 6 5 - 15  CBC     Status: Abnormal   Collection Time: 09/30/16  1:15 AM  Result Value Ref Range   WBC 9.5 4.0 - 10.5 K/uL   RBC 4.70  4.22 - 5.81 MIL/uL   Hemoglobin 15.6 13.0 - 17.0 g/dL   HCT 48.7 39.0 - 52.0 %   MCV 103.6 (H) 78.0 - 100.0 fL   MCH 33.2 26.0 - 34.0 pg   MCHC 32.0 30.0 - 36.0 g/dL   RDW 12.7 11.5 - 15.5 %   Platelets 170 150 - 400 K/uL  Sodium     Status: Abnormal   Collection Time: 09/30/16  6:34 PM  Result Value Ref Range   Sodium 157 (H) 135 - 145 mmol/L  Basic metabolic panel     Status: Abnormal   Collection Time: 10/01/16 12:49 AM  Result Value Ref Range   Sodium 156 (H) 135 - 145 mmol/L   Potassium 3.6 3.5 - 5.1 mmol/L    Comment: DELTA CHECK NOTED   Chloride 123 (H) 101 - 111 mmol/L   CO2 29 22 - 32 mmol/L   Glucose, Bld 126 (H) 65 - 99 mg/dL   BUN 29 (H) 6 - 20 mg/dL   Creatinine, Ser 1.07 0.61 - 1.24 mg/dL   Calcium 8.9 8.9 - 10.3 mg/dL   GFR calc non Af Amer >60 >60 mL/min   GFR calc Af Amer >60 >60 mL/min    Comment: (NOTE) The eGFR has  been calculated using the CKD EPI equation. This calculation has not been validated in all clinical situations. eGFR's persistently <60 mL/min signify possible Chronic Kidney Disease.    Anion gap 4 (L) 5 - 15  Basic metabolic panel     Status: Abnormal   Collection Time: 10/01/16  7:41 AM  Result Value Ref Range   Sodium 155 (H) 135 - 145 mmol/L   Potassium 3.5 3.5 - 5.1 mmol/L   Chloride 121 (H) 101 - 111 mmol/L   CO2 29 22 - 32 mmol/L   Glucose, Bld 127 (H) 65 - 99 mg/dL   BUN 29 (H) 6 - 20 mg/dL   Creatinine, Ser 1.04 0.61 - 1.24 mg/dL   Calcium 9.0 8.9 - 10.3 mg/dL   GFR calc non Af Amer >60 >60 mL/min   GFR calc Af Amer >60 >60 mL/min    Comment: (NOTE) The eGFR has been calculated using the CKD EPI equation. This calculation has not been validated in all clinical situations. eGFR's persistently <60 mL/min signify possible Chronic Kidney Disease.    Anion gap 5 5 - 15  CBC     Status: Abnormal   Collection Time: 10/01/16  7:41 AM  Result Value Ref Range   WBC 11.7 (H) 4.0 - 10.5 K/uL   RBC 4.95 4.22 - 5.81 MIL/uL    Hemoglobin 16.7 13.0 - 17.0 g/dL   HCT 51.2 39.0 - 52.0 %   MCV 103.4 (H) 78.0 - 100.0 fL   MCH 33.7 26.0 - 34.0 pg   MCHC 32.6 30.0 - 36.0 g/dL   RDW 12.5 11.5 - 15.5 %   Platelets 155 150 - 400 K/uL  Magnesium     Status: None   Collection Time: 10/01/16  7:41 AM  Result Value Ref Range   Magnesium 2.4 1.7 - 2.4 mg/dL  Glucose, capillary     Status: Abnormal   Collection Time: 10/01/16 12:26 PM  Result Value Ref Range   Glucose-Capillary 130 (H) 65 - 99 mg/dL  Sodium     Status: Abnormal   Collection Time: 10/01/16  2:58 PM  Result Value Ref Range   Sodium 156 (H) 135 - 145 mmol/L    Ct Head Wo Contrast  Addendum Date: 10/01/2016   ADDENDUM REPORT: 10/01/2016 13:25 ADDENDUM: Study discussed by telephone with Dr. Antony Richmond on 10/01/2016 at 1310 hours. Electronically Signed   By: Genevie Ann M.D.   On: 10/01/2016 13:25   Result Date: 10/01/2016 CLINICAL DATA:  51 year old male with decreased responsiveness today. Presented with Left hemisphere intra-axial hemorrhage on 09/24/2016. EXAM: CT HEAD WITHOUT CONTRAST TECHNIQUE: Contiguous axial images were obtained from the base of the skull through the vertex without intravenous contrast. COMPARISON:  09/30/2016 and earlier. FINDINGS: Brain: Heterogeneously hyperdense lobulated hemorrhage throughout the left deep gray matter nuclei tracking both cephalad into the posterior left frontal lobe and also into the left midbrain (series 3, image 14) is stable since 09/28/2016 encompassing 61 x 50 x 54 mm (AP by transverse by CC) for an estimated blood volume of at least 82 mL. Confluent surrounding edema. Regional mass effect appears stable including rightward midline shift up to 10 mm. Effaced left lateral ventricle with no definite intraventricular or extra-axial extension. Stable basilar cisterns since 09/28/2016. There is trapping of both lateral ventricles since 09/24/2016 (See right temporal horn series 3, image 10 and compare to series 3, image 11  on 09/24/2016). The third ventricle remains effaced. The fourth ventricle size is stable. Stable  gray-white matter differentiation in the right hemisphere and posterior fossa. Vascular: Calcified atherosclerosis at the skull base. Skull: Negative.  No acute osseous abnormality identified. Sinuses/Orbits: Visualized paranasal sinuses and mastoids are stable and well pneumatized. Other: No acute orbit or scalp soft tissue finding. IMPRESSION: 1. Lateral ventriculomegaly related to trapping of the lateral ventricles since 09/24/2016. Consider ventricular decompression. 2. Stable since 09/28/2016 size and configuration of the heterogeneous and lobulated intra-axial hemorrhage centered at the left deep gray matter nuclei. Intra-axial blood volume estimated at 80-90 mL. 3. Stable intracranial mass effect including rightward midline shift of 10 mm. No intraventricular or extra-axial extension of hemorrhage. Electronically Signed: By: Genevie Ann M.D. On: 10/01/2016 13:07   Ct Head Wo Contrast  Result Date: 09/30/2016 CLINICAL DATA:  Intracranial hemorrhage.  Follow-up. EXAM: CT HEAD WITHOUT CONTRAST TECHNIQUE: Contiguous axial images were obtained from the base of the skull through the vertex without intravenous contrast. COMPARISON:  09/28/2016 FINDINGS: Brain: Large left cerebral hematoma centered in the deep gray nuclei has not significantly changed in size, measuring 4.6 x 7.2 x 5.2 cm (calculated volume 86 cc). Surrounding vasogenic edema and 9 mm of rightward midline shift are unchanged, as is left lateral ventricle effacement. There is no interval and ventricular dilatation. No acute large territory cortically based infarct or extra-axial fluid collection is identified. A chronic right basal ganglia lacunar infarct is noted. Vascular: Calcified atherosclerosis at the skullbase. Skull: No fracture or focal osseous lesion. Sinuses/Orbits: Small left maxillary sinus mucous retention cyst. Clear mastoid air cells.  Unremarkable orbits. Other: None. IMPRESSION: Unchanged large left cerebral hematoma centered in the deep gray nuclei. Unchanged midline shift. Electronically Signed   By: Logan Bores M.D.   On: 09/30/2016 11:43    Review of Systems  Unable to perform ROS: Acuity of condition   Blood pressure 109/62, pulse 66, temperature 98.5 F (36.9 C), temperature source Axillary, resp. rate 20, height _0  (1.702 m), weight 94.2 kg (207 lb 10.8 oz), SpO2 96 %. Physical Exam  Constitutional: He appears well-developed and well-nourished. He appears listless.  HENT:  Head: Normocephalic and atraumatic.  Eyes: Pupils are equal, round, and reactive to light. Conjunctivae and EOM are normal.  Neck: Normal range of motion. Neck supple.  Neurological: He appears listless. Coordination normal.  Somnolent, sonorous breathing Not following commands Perrl, +cough +corneals Plegic on right side, moving left side purposefully Confined to bed    Assessment/Plan: I was called to place a ventricular catheter. There is no hydrocephalus, there is a midline shift. I will place ventricular catheter. Large hemorrhage and surrounding edema do seem to explain depressed level of alertness.   Juanice Warburton L 10/01/2016, 7:01 PM

## 2016-10-01 NOTE — Progress Notes (Signed)
STROKE TEAM PROGRESS NOTE   SUBJECTIVE (INTERVAL HISTORY) 3% saline was stopped and monitoring on pudding thickened diet alone this morning. He has been given Ativan for presumed etoh withdrawal symptoms. He is awake but somnolent more than yesterday. Repeat head CT was obtained indicating ventricular enlargement with stable blood volume and mass effect. Hypertonic saline was stopped but is being resumed for evidence of worsened mental status.Serum sodium this am is yet 156, 155  OBJECTIVE Temp:  [98.2 F (36.8 C)-99 F (37.2 C)] 98.7 F (37.1 C) (10/01 1200) Pulse Rate:  [25-101] 88 (10/01 1000) Cardiac Rhythm: Normal sinus rhythm (10/01 0800) Resp:  [14-32] 14 (10/01 1000) BP: (96-164)/(76-122) 160/111 (10/01 1000) SpO2:  [90 %-99 %] 96 % (10/01 1000)  CBC:   Recent Labs Lab 09/24/16 2029  09/30/16 0115 10/01/16 0741  WBC 9.8  < > 9.5 11.7*  NEUTROABS 5.5  --   --   --   HGB 16.3  < > 15.6 16.7  HCT 46.7  < > 48.7 51.2  MCV 94.7  < > 103.6* 103.4*  PLT 187  < > 170 155  < > = values in this interval not displayed.  Basic Metabolic Panel:   Recent Labs Lab 10/01/16 0049 10/01/16 0741  NA 156* 155*  K 3.6 3.5  CL 123* 121*  CO2 29 29  GLUCOSE 126* 127*  BUN 29* 29*  CREATININE 1.07 1.04  CALCIUM 8.9 9.0  MG  --  2.4    Lipid Panel:     Component Value Date/Time   CHOL 210 (H) 09/26/2016 0407   TRIG 153 (H) 09/26/2016 0407   HDL 55 09/26/2016 0407   CHOLHDL 3.8 09/26/2016 0407   VLDL 31 09/26/2016 0407   LDLCALC 124 (H) 09/26/2016 0407   HgbA1c:  Lab Results  Component Value Date   HGBA1C 5.8 (H) 09/26/2016   Urine Drug Screen:     Component Value Date/Time   LABOPIA NONE DETECTED 09/24/2016 2127   COCAINSCRNUR NONE DETECTED 09/24/2016 2127   LABBENZ NONE DETECTED 09/24/2016 2127   AMPHETMU NONE DETECTED 09/24/2016 2127   THCU NONE DETECTED 09/24/2016 2127   LABBARB NONE DETECTED 09/24/2016 2127    Alcohol Level     Component Value Date/Time    ETH <5 09/24/2016 2351    IMAGING I have personally reviewed the radiological images below and agree with the radiology interpretations.  Ct Angio Head W Or Wo Contrast Ct Head Code Stroke Wo Contrast  09/24/2016 IMPRESSION: CT HEAD: 1. Large LEFT thalamus and surrounding structure intraparenchymal hematoma. 3 mm LEFT-to-RIGHT midline shift without ventricular entrapment or hydrocephalus. 2. Old RIGHT basal ganglia lacunar infarct. CTA HEAD: 1. Angiographic spot sign central within LEFT hematoma consistent with active hemorrhage. 2. No emergent large vessel occlusion. 3. Severe stenosis LEFT P2 segment most compatible with atherosclerosis. Moderate stenosis anterior circulation, likely due to atherosclerosis though nonspecific in the presence of hemorrhage.   CT Head Code Stroke 09/24/2016 IMPRESSION: 3.6 x 5.9 x 4.6 cm (volume = 51 cm^3) dense LEFT thalamus, Basal ganglia and external capsule hematoma. 3 mm LEFT-to-RIGHT midline shift. No ventricular entrapment or hydrocephalus. No acute large vascular territory infarct. Old RIGHT basal ganglia lacunar infarct. No abnormal extra-axial fluid collections. Basal cisterns are patent. VASCULAR: Moderate calcific atherosclerosis carotid siphons.  CT Head 09/24/2016 IMPRESSION: 1. Increased size of the intraparenchymal hematoma centered in the left thalamus and basal ganglia, which now has a calculated volume of 59 cc, previously 51 cc. This measurement  may underestimate the degree of increase, as there are new irregular extensions arising from the margins of the primary hematoma. No new, remote hemorrhagic foci. 2. Unchanged 4 mm rightward midline shift with mass effect on the ventricles. Basal cisterns remain patent. 3. No intraventricular hemorrhagic extension or hydrocephalus. No ventricular trapping.   CT Head 09/25/2016 1. Enlarging 78 cc hematoma epicenter LEFT deep gray nuclei, increased from 59 cc. New satellite 4 mm hemorrhage. 2. Worsening  7 mm LEFT-to-RIGHT midline shift. No ventricular entrapment. 3. Old RIGHT basal ganglia lacunar infarct.  TTE 09/25/2016 Study Conclusions - Left ventricle: The cavity size was normal. Systolic function was normal. The estimated ejection fraction was in the range of 55% to 60%. Wall motion was normal; there were no regional wall motion abnormalities. Doppler parameters are consistent with abnormal left ventricular relaxation (grade 1 diastolic dysfunction). - Mitral valve: Calcified annulus. Impressions: - Normal LV systolic function; mild diastolic dysfunction.  CT head 09/28/16 Large hematoma left frontal parietal operculum unchanged. Increasedlow-density edema surrounding the hematoma. 9 mm midline shift to the right unchanged.  CT HEAD WITHOUT CONTRAST 10/01/16 IMPRESSION: 1. Lateral ventriculomegaly related to trapping of the lateral ventricles since 09/24/2016. Consider ventricular decompression. 2. Stable since 09/28/2016 size and configuration of the heterogeneous and lobulated intra-axial hemorrhage centered at the left deep gray matter nuclei. Intra-axial blood volume estimated at 80-90 mL. 3. Stable intracranial mass effect including rightward midline shift of 10 mm. No intraventricular or extra-axial extension of Hemorrhage.  PHYSICAL EXAM  Temp:  [98.2 F (36.8 C)-99 F (37.2 C)] 98.7 F (37.1 C) (10/01 1200) Pulse Rate:  [25-101] 88 (10/01 1000) Resp:  [14-32] 14 (10/01 1000) BP: (96-164)/(76-122) 160/111 (10/01 1000) SpO2:  [90 %-99 %] 96 % (10/01 1000)   Vitals:   10/01/16 0830 10/01/16 0900 10/01/16 1000 10/01/16 1200  BP: (!) 125/100 (!) 142/112 (!) 160/111   Pulse: 79 90 88   Resp: (!) 21 (!) 29 14   Temp:    98.7 F (37.1 C)  TempSrc:    Axillary  SpO2: 91% 93% 96%   Weight:      Height:          General - Well nourished, well developed, awake but somnolent, globally aphasic  Ophthalmologic - Fundi not visualized due to  noncooperation.  Cardiovascular - Regular rate and rhythm.  Neuro - somnolent, minimal participation on exam, expressive and receptive aphasia, no speech output, able to follow limited simple commands. PERRL. Right facial droop, tongue midline. Right hemiplegic, 0/5, left upper and lower extremity spontaneous movement against gravity. Right Babinski positive. Sensation, coordination not cooperative and gait not tested.    ASSESSMENT/PLAN [name] is a 51 y.o. male with history of hypertension and alcohol abuse presenting with aphasia and right-sided weakness.  He did not receive IV t-PA due to ICH.   ICH: Large L thalamic ICH, secondary to untreated hypertensive crisis.  Resultant  left gaze, right neglect, expressive aphasia, right hemiplegia  CT head:  3.6 x 5.9 x 4.6 cm (volume = 51 cm^3) dense LEFT thalamus, basal ganglia, and external capsule hematoma with 3 mm LEFT-to-RIGHT midline shift.  Repeat CT x 2 on 09/25/16 gradual enlargement of hematoma to 78cc with increased midline shift  CT repeat 09/26/16 hematoma 81cc with increased midline shift    2D Echo: EF 55-60%. No source of embolus  LDL 124  HgbA1c 5.8  SCDs for VTE prophylaxis DIET - DYS 1 Room service appropriate? Yes; Fluid consistency:  Pudding Thick  No antithrombotic prior to admission, now on No antithrombotic  Ongoing aggressive stroke risk factor management  Therapy recommendations:  PT/OT consider CIR  Disposition:  Pending  Cerebral edema  Repeat CT showed gradual enlargement of hematoma with increased midline shift  CT repeat 09/28/16 stable hematoma and slight increase of edema and midline shift  CT repeat 10/01/16 stable hematoma midline shift with increased lateral ventricular trapping  On 3% saline  23.4% saline PRN for Na goal 150-160  Mental status seems worse from weekend  Discussed with neurosurgery Dr. Franky Macho to consider ventriculostomy for increased trapping  Hypertensive  emergency  BP high on admission  BP goal < 180  Wean cleviprex with higher SBP limit goal  On novasc and lisinopril, clonidine  Alcohol abuse  4-18 beers daily  On CIWA protocol  Not on precedex since last week  B1/FA/MVI  Hyperlipidemia  Home meds: none  LDL 124, goal < 70  No need to initiate statin at acute ICH phase.   Dysphagia   pass swallow  On dys 1 and pudding thick liquid  Close supervision  Other Stroke Risk Factors  Obesity, Body mass index is 32.53 kg/m., recommend weight loss, diet and exercise as appropriate   Other Active Problems  Hyperglycemia  Leukocytosis, afebrile, no Rx for infection at this time  Hospital day # 7 I have personally examined this patient, reviewed notes, independently viewed imaging studies, participated in medical decision making and plan of care.ROS completed by me personally and pertinent positives fully documented  I have made any additions or clarifications directly to the above note. The patient is has shown neurological decline since yesterday and follow-up imaging shows persistent cytotoxic edema with trapping of the lateral ventricles suggesting worsening hydrocephalus. Neurosurgery has been consulted to consider ventriculostomy drainage. Will change serum sodium goal 155-160 and resume hypertonic saline as necessary. No family available at the bedside for discussion. This patient is critically ill due to large thalamic ICH, cerebral edema, alcohol abuse, hypertensive emergency and at significant risk of neurological worsening, death form hematoma expansion, brain herniation, seizure, status epilepticus. This patient's care requires constant monitoring of vital signs, hemodynamics, respiratory and cardiac monitoring, review of multiple databases, neurological assessment, discussion with family, other specialists and medical decision making of high complexity. I spent 45 minutes of neuro critical care time  Delia Heady,  MD Medical Director [facility] Pager: [phone] 10/01/2016 4:13 PM     To contact Stroke Continuity provider, please refer to [email or web address]. After hours, contact General Neurology

## 2016-10-01 NOTE — Op Note (Signed)
     PATIENRally Ouch Shahmeer D Richmond  51 y.o. male with trapped ventricles.    PRE-OPERATIVE DIAGNOSIS: obstructive hydrocephalus  POST-OPERATIVE DIAGNOSIS:  Same  PROCEDURE:  Right frontal ventricular catheter placement  SURGEON:  Setsuko Robins ANESTHESIA:   local DRAINS: Ventriculostomy Drain in the right lateral ventricle.   SPECIMEN: none  DICTATION: [name] had male  head shaved and then prepared in a sterile manner. I draped male  in a sterile manner. I injected lidocaine into the planned coronal incision in line with the right pupillary line anterior to the tragus. I opened the skin with a 15 blade. I used the hand twist drill to create a burr hole. I opened the dura with the 20 gauge spinal needle. I passed the ventricular catheter into the lateral ventricle. There was brisk flow of spinal fluid. I tunneled the catheter posteriorly and secured it to the skin at the exit. I approximated the scalp edges with nylon sutures. I placed a sterile dressing, then connected the catheter to the drainage system.   PLAN OF CARE: Admit to inpatient   PATIENT DISPOSITION:  PACU - hemodynamically stable.

## 2016-10-02 ENCOUNTER — Inpatient Hospital Stay (HOSPITAL_COMMUNITY): Payer: BLUE CROSS/BLUE SHIELD

## 2016-10-02 DIAGNOSIS — R4182 Altered mental status, unspecified: Secondary | ICD-10-CM

## 2016-10-02 DIAGNOSIS — R069 Unspecified abnormalities of breathing: Secondary | ICD-10-CM

## 2016-10-02 DIAGNOSIS — G935 Compression of brain: Secondary | ICD-10-CM

## 2016-10-02 LAB — POCT I-STAT 3, ART BLOOD GAS (G3+)
ACID-BASE EXCESS: 5 mmol/L — AB (ref 0.0–2.0)
Bicarbonate: 29.4 mmol/L — ABNORMAL HIGH (ref 20.0–28.0)
O2 SAT: 97 %
PH ART: 7.431 (ref 7.350–7.450)
Patient temperature: 99.9
TCO2: 31 mmol/L (ref 22–32)
pCO2 arterial: 44.6 mmHg (ref 32.0–48.0)
pO2, Arterial: 91 mmHg (ref 83.0–108.0)

## 2016-10-02 LAB — BASIC METABOLIC PANEL
ANION GAP: 6 (ref 5–15)
BUN: 32 mg/dL — AB (ref 6–20)
CHLORIDE: 118 mmol/L — AB (ref 101–111)
CO2: 31 mmol/L (ref 22–32)
CREATININE: 1.2 mg/dL (ref 0.61–1.24)
Calcium: 8.8 mg/dL — ABNORMAL LOW (ref 8.9–10.3)
GFR calc Af Amer: >60 mL/min (ref >60)
GFR calc non Af Amer: >60 mL/min (ref >60)
GLUCOSE: 124 mg/dL — AB (ref 65–99)
Potassium: 3.6 mmol/L (ref 3.5–5.1)
Sodium: 155 mmol/L — ABNORMAL HIGH (ref 135–145)

## 2016-10-02 LAB — GLUCOSE, CAPILLARY
GLUCOSE-CAPILLARY: 105 mg/dL — AB (ref 65–99)
GLUCOSE-CAPILLARY: 103 mg/dL — AB (ref 65–99)
GLUCOSE-CAPILLARY: 110 mg/dL — AB (ref 65–99)
Glucose-Capillary: 118 mg/dL — ABNORMAL HIGH (ref 65–99)

## 2016-10-02 LAB — CBC
HCT: 49.9 % (ref 39.0–52.0)
Hemoglobin: 15.8 g/dL (ref 13.0–17.0)
MCH: 33.1 pg (ref 26.0–34.0)
MCHC: 31.7 g/dL (ref 30.0–36.0)
MCV: 104.4 fL — ABNORMAL HIGH (ref 78.0–100.0)
PLATELETS: 133 10*3/uL — AB (ref 150–400)
RBC: 4.78 MIL/uL (ref 4.22–5.81)
RDW: 12.5 % (ref 11.5–15.5)
WBC: 12.6 10*3/uL — ABNORMAL HIGH (ref 4.0–10.5)

## 2016-10-02 LAB — SODIUM
SODIUM: 157 mmol/L — AB (ref 135–145)
SODIUM: 157 mmol/L — AB (ref 135–145)
Sodium: 156 mmol/L — ABNORMAL HIGH (ref 135–145)
Sodium: 155 mmol/L — ABNORMAL HIGH (ref 135–145)

## 2016-10-02 LAB — PHOSPHORUS
Phosphorus: 4.4 mg/dL (ref 2.5–4.6)
Phosphorus: 3.7 mg/dL (ref 2.5–4.6)

## 2016-10-02 LAB — MAGNESIUM
Magnesium: 2.5 mg/dL — ABNORMAL HIGH (ref 1.7–2.4)
Magnesium: 2.5 mg/dL — ABNORMAL HIGH (ref 1.7–2.4)

## 2016-10-02 MED ORDER — IPRATROPIUM-ALBUTEROL 0.5-2.5 (3) MG/3ML IN SOLN
RESPIRATORY_TRACT | Status: AC
  Filled 2016-10-02: qty 3

## 2016-10-02 MED ORDER — ENOXAPARIN SODIUM 40 MG/0.4ML ~~LOC~~ SOLN
40.0000 mg | SUBCUTANEOUS | Status: DC
  Administered 2016-10-02 – 2016-10-06 (×5): 40 mg via SUBCUTANEOUS
  Filled 2016-10-02 (×5): qty 0.4

## 2016-10-02 MED ORDER — OSMOLITE 1.2 CAL PO LIQD
1000.0000 mL | ORAL | Status: DC
  Administered 2016-10-03 – 2016-10-06 (×4): 1000 mL
  Filled 2016-10-02 (×9): qty 1000

## 2016-10-02 MED ORDER — HYDRALAZINE HCL 20 MG/ML IJ SOLN: 5.0000 mg | INTRAMUSCULAR | Status: DC | PRN

## 2016-10-02 MED ORDER — VITAL HIGH PROTEIN PO LIQD: 1000.0000 mL | ORAL | Status: DC

## 2016-10-02 MED ORDER — IPRATROPIUM-ALBUTEROL 0.5-2.5 (3) MG/3ML IN SOLN
3.0000 mL | Freq: Once | RESPIRATORY_TRACT | Status: AC
  Administered 2016-10-02: 3 mL via RESPIRATORY_TRACT

## 2016-10-02 MED ORDER — PRO-STAT SUGAR FREE PO LIQD
30.0000 mL | Freq: Two times a day (BID) | ORAL | Status: DC
  Administered 2016-10-03 – 2016-10-06 (×7): 30 mL
  Filled 2016-10-02 (×7): qty 30

## 2016-10-02 NOTE — Progress Notes (Signed)
EEG completed bedside results pending. °

## 2016-10-02 NOTE — Progress Notes (Signed)
Attempted to see pt this am.  Pt had ventric placed last night.  Nursing stated pt not responding and asked to defer pt to another day.  Tory Emerald, Leonard [phone]

## 2016-10-02 NOTE — Progress Notes (Signed)
Called by nurse due to increased respirtatory rate. Some expiratory wheezes on exam. He seems lethargic, but with left sided stimulation, he does localize. EVD in place and draining.   1) Duoneb given wheezing.  2) abg.  3) If no improvement, may need to call CCM for assessment.   Ritta Slot, MD [facility] [phone]  If 7pm- 7am, please page neurology on call as listed in AMION.

## 2016-10-02 NOTE — Procedures (Signed)
ELECTROENCEPHALOGRAM REPORT  Date of Study: 10/02/2016  Patient's Name: [name] MRN: [id number] Date of Birth: Nov 02, 1965  Referring Provider: Dr. Delia Heady  Clinical History: This is a 51 year old man s/p intracranial hemorrhage with worsening lethargy.  Medications: Amlodipine Clonidine Folic acid Lisinopril Thiamine  Technical Summary: A multichannel digital EEG recording measured by the international 10-20 system with electrodes applied with paste and impedances below 5000 ohms performed as portable with EKG monitoring in an unresponsive patient.  Hyperventilation and photic stimulation were not performed.  The digital EEG was referentially recorded, reformatted, and digitally filtered in a variety of bipolar and referential montages for optimal display.   Description: The patient is unresponsive during the recording. Thre is no clear posterior dominant rhythm. The background consists of a large amount of 4-5 Hz theta and 2-3 Hz delta slowing. Breach artifact with higher voltage activity is seen over the right hemisphere. Normal sleep architecture is not seen.  Hyperventilation and photic stimulation were not performed.  There were no epileptiform discharges or electrographic seizures seen.    EKG lead was unremarkable.  Impression: This EEG is abnormal due to the presence of: 1. Moderate diffuse background slowing 2. Breach artifact over the right hemisphere  Clinical Correlation of the above findings indicates diffuse cerebral dysfunction that is non-specific in etiology and can be seen with hypoxic/ischemic injury, toxic/metabolic encephalopathies,or medication effect. Breach artifact is consistent with ventricular drain in this region. The absence of epileptiform discharges does not rule out a clinical diagnosis of epilepsy.  Clinical correlation is advised.   Patrcia Dolly, M.D.

## 2016-10-02 NOTE — Progress Notes (Signed)
Patient ID: [name], male   DOB: 14-May-1965, 51 y.o.   MRN: [id number] BP (!) 147/92   Pulse 79   Temp (!) 97.5 F (36.4 C) (Axillary)   Resp (!) 23   Ht  (1.702 m)   Wt 94.2 kg (207 lb 10.8 oz)   SpO2 98%   BMI 32.53 kg/m  Patient is somnolent, not following commands Ventricular catheter is draining clear fluid.  There is no change in his neurological exam.

## 2016-10-02 NOTE — Progress Notes (Signed)
STROKE TEAM PROGRESS NOTE  HISTORY OF PRESENT ILLNESS [name] is an 51 y.o. male with PMH of HTN,alcohol abuse who presents as a stroke alert for aphasia and right-sided weakness. He was last seen normal around 7:30 PM when his girlfriend/spouse heard a loud thud. Patient was found on the floor and not speaking. EMS was called immediately and found the patient to be plegic on the right side, aphasic and had a left gaze deviation Blood pressure was 230 systolic at the scene. On arrival to [facility] ER, the patient is no longer following commands. A stat CT head was obtained which demonstrated a large left thalamic hemorrhage. The patient has a history of hypertension but not on any blood pressure medications. He is not on aspirin or any blood thinners.   SUBJECTIVE (INTERVAL HISTORY) [name] is increasingly somnolent since yesterday morning. Repeat head CT showed enlargement of ICH and surrounding cytotoxic edema. He was restarted on 3% saline to a sodium goal of 155-160. Neurosurgery was consulted and placed ventriculostomy for trapped ventricles without significantly high ICP noted or subsequent change in status. Blood pressure was also moderately hypotensive to 80s/60s transiently. He remains somnolent despite normalization of blood pressure and ventriculostomy drainage. NG tube to be placed today as he is not awake to eat or participate in therapy.   OBJECTIVE Temp:  [98.5 F (36.9 C)-100.2 F (37.9 C)] 99.9 F (37.7 C) (10/02 1200) Pulse Rate:  [61-87] 72 (10/02 1300) Cardiac Rhythm: Normal sinus rhythm (10/02 1200) Resp:  [14-29] 18 (10/02 1300) BP: (85-158)/(62-95) 128/80 (10/02 1300) SpO2:  [93 %-99 %] 98 % (10/02 1300)  CBC:   Recent Labs Lab 10/01/16 0741 10/02/16 1113  WBC 11.7* 12.6*  HGB 16.7 15.8  HCT 51.2 49.9  MCV 103.4* 104.4*  PLT 155 133*    Basic Metabolic Panel:   Recent Labs Lab 10/01/16 0741  10/02/16 0815 10/02/16 1113  NA 155*  < > 157*  157*  155*  K 3.5  --   --  3.6  CL 121*  --   --  118*  CO2 29  --   --  31  GLUCOSE 127*  --   --  124*  BUN 29*  --   --  32*  CREATININE 1.04  --   --  1.20  CALCIUM 9.0  --   --  8.8*  MG 2.4  --   --  2.5*  PHOS  --   --   --  4.4  < > = values in this interval not displayed.  Lipid Panel:     Component Value Date/Time   CHOL 210 (H) 09/26/2016 0407   TRIG 153 (H) 09/26/2016 0407   HDL 55 09/26/2016 0407   CHOLHDL 3.8 09/26/2016 0407   VLDL 31 09/26/2016 0407   LDLCALC 124 (H) 09/26/2016 0407   HgbA1c:  Lab Results  Component Value Date   HGBA1C 5.8 (H) 09/26/2016   Urine Drug Screen:     Component Value Date/Time   LABOPIA NONE DETECTED 09/24/2016 2127   COCAINSCRNUR NONE DETECTED 09/24/2016 2127   LABBENZ NONE DETECTED 09/24/2016 2127   AMPHETMU NONE DETECTED 09/24/2016 2127   THCU NONE DETECTED 09/24/2016 2127   LABBARB NONE DETECTED 09/24/2016 2127    Alcohol Level     Component Value Date/Time   ETH <5 09/24/2016 2351    IMAGING I have personally reviewed the radiological images below and agree with the radiology interpretations.  Ct Angio  Head W Or Wo Contrast Ct Head Code Stroke Wo Contrast  09/24/2016 IMPRESSION: CT HEAD: 1. Large LEFT thalamus and surrounding structure intraparenchymal hematoma. 3 mm LEFT-to-RIGHT midline shift without ventricular entrapment or hydrocephalus. 2. Old RIGHT basal ganglia lacunar infarct. CTA HEAD: 1. Angiographic spot sign central within LEFT hematoma consistent with active hemorrhage. 2. No emergent large vessel occlusion. 3. Severe stenosis LEFT P2 segment most compatible with atherosclerosis. Moderate stenosis anterior circulation, likely due to atherosclerosis though nonspecific in the presence of hemorrhage.   CT Head Code Stroke 09/24/2016 IMPRESSION: 3.6 x 5.9 x 4.6 cm (volume = 51 cm^3) dense LEFT thalamus, Basal ganglia and external capsule hematoma. 3 mm LEFT-to-RIGHT midline shift. No ventricular entrapment  or hydrocephalus. No acute large vascular territory infarct. Old RIGHT basal ganglia lacunar infarct. No abnormal extra-axial fluid collections. Basal cisterns are patent. VASCULAR: Moderate calcific atherosclerosis carotid siphons.  CT Head 09/24/2016 IMPRESSION: 1. Increased size of the intraparenchymal hematoma centered in the left thalamus and basal ganglia, which now has a calculated volume of 59 cc, previously 51 cc. This measurement may underestimate the degree of increase, as there are new irregular extensions arising from the margins of the primary hematoma. No new, remote hemorrhagic foci. 2. Unchanged 4 mm rightward midline shift with mass effect on the ventricles. Basal cisterns remain patent. 3. No intraventricular hemorrhagic extension or hydrocephalus. No ventricular trapping.   CT Head 09/25/2016 1. Enlarging 78 cc hematoma epicenter LEFT deep gray nuclei, increased from 59 cc. New satellite 4 mm hemorrhage. 2. Worsening 7 mm LEFT-to-RIGHT midline shift. No ventricular entrapment. 3. Old RIGHT basal ganglia lacunar infarct.  TTE 09/25/2016 Study Conclusions - Left ventricle: The cavity size was normal. Systolic function was normal. The estimated ejection fraction was in the range of 55% to 60%. Wall motion was normal; there were no regional wall motion abnormalities. Doppler parameters are consistent with abnormal left ventricular relaxation (grade 1 diastolic dysfunction). - Mitral valve: Calcified annulus. Impressions: - Normal LV systolic function; mild diastolic dysfunction.  CT head 09/28/16 Large hematoma left frontal parietal operculum unchanged. Increasedlow-density edema surrounding the hematoma. 9 mm midline shift to the right unchanged.  CT head wo contrast 10/01/16 IMPRESSION: 1. Lateral ventriculomegaly related to trapping of the lateral ventricles since 09/24/2016. Consider ventricular decompression. 2. Stable since 09/28/2016 size and configuration of the  heterogeneous and lobulated intra-axial hemorrhage centered at the left deep gray matter nuclei. Intra-axial blood volume estimated at 80-90 mL. 3. Stable intracranial mass effect including rightward midline shift of 10 mm. No intraventricular or extra-axial extension of Hemorrhage.  PHYSICAL EXAM  Temp:  [98.5 F (36.9 C)-100.2 F (37.9 C)] 99.9 F (37.7 C) (10/02 1200) Pulse Rate:  [61-87] 72 (10/02 1300) Resp:  [14-29] 18 (10/02 1300) BP: (85-158)/(62-95) 128/80 (10/02 1300) SpO2:  [93 %-99 %] 98 % (10/02 1300)   Vitals:   10/02/16 1027 10/02/16 1100 10/02/16 1200 10/02/16 1300  BP:  128/85 116/83 128/80  Pulse:  65 73 72  Resp:  16 (!) 23 18  Temp: 98.8 F (37.1 C)  99.9 F (37.7 C)   TempSrc: Axillary     SpO2:  97% 96% 98%  Weight:      Height:         General - Well nourished, well developed, somnolent and yawning, globally aphasic  Ophthalmologic - Fundi not visualized due to noncooperation.  Cardiovascular - Regular rate and rhythm.  Neuro - somnolent, minimal participation on exam, expressive and receptive aphasia, no speech  output, no longer following any significant commands. Right facial droop, tongue midline. Right hemiplegic 0/5, left upper and lower extremity spontaneous movement against gravity. Right Babinski positive. Sensation, coordination, and gait not tested due to mental status.    ASSESSMENT/PLAN [name] is a 51 y.o. male with history of hypertension and alcohol abuse presenting with aphasia and right-sided weakness.  He did not receive IV t-PA due to ICH.   ICH: Large L thalamic ICH, secondary to untreated hypertensive crisis. Delayed increasing cytotoxic edema and mass effect and worsening neuro exam  Resultant  left gaze, right neglect, expressive aphasia, right hemiplegia  CT head:  3.6 x 5.9 x 4.6 cm (volume = 51 cm^3) dense LEFT thalamus, basal ganglia, and external capsule hematoma with 3 mm LEFT-to-RIGHT midline shift.  Repeat  CT x 2 on 09/25/16 gradual enlargement of hematoma to 78cc with increased midline shift  CT repeat 09/26/16 hematoma 81cc with increased midline shift  2D Echo: EF 55-60%. No source of embolus  LDL 124  HgbA1c 5.8  SCDs for VTE prophylaxis Diet NPO time specified  No antithrombotic prior to admission, now on No antithrombotic  Ongoing aggressive stroke risk factor management  Therapy recommendations:  PT/OT consider CIR  Disposition:  Pending  Cerebral edema  Repeat CT showed gradual enlargement of hematoma with increased midline shift  CT repeat 09/28/16 stable hematoma and slight increase of edema and midline shift  CT repeat 10/01/16 stable hematoma midline shift with increased lateral ventricular trapping  On 3% saline goal 155-160  Mental status seems worse from weekend  ICP not highly elevated, MAP goal > 80 for adequate CPP  Hypertensive emergency  BP high on admission  SBP goal < 180  Holding antihypertensives today for transient lows yesterday - novasc and lisinopril, clonidine (MAP goal >80)  Alcohol abuse  4-18 beers daily  On CIWA protocol  Not on precedex since last week  B1/FA/MVI  Hyperlipidemia  Home meds: none  LDL 124, goal < 70  No need to initiate statin at acute ICH phase.   Dysphagia   pass swallow  On dys 1 and pudding thick liquid  Currently NPO 2/2 mental status  Other Stroke Risk Factors  Obesity, Body mass index is 32.53 kg/m., recommend weight loss, diet and exercise as appropriate   Fever  Leukocytosis trending up to 12.6, isolated 100.27F overnight. No strong focal evidence of infection up till now. . If fever occurs again can pursue cultures, DG chest, empiric abtx.  Other Active Problems  Hyperglycemia, currently normal  Hospital day # 8  I have personally examined this patient, reviewed notes, independently viewed imaging studies, participated in medical decision making and plan of care.ROS completed by me  personally and pertinent positives fully documented  I have made any additions or clarifications directly to the above note.   Recommend continue strict control of blood pressure. Panda tube for tube feeding and medications. Check EEG for nonconvulsive seizures. Family not available at the bedside for discussion. This patient is critically ill and at significant risk of neurological worsening, death and care requires constant monitoring of vital signs, hemodynamics,respiratory and cardiac monitoring, extensive review of multiple databases, frequent neurological assessment, discussion with family, other specialists and medical decision making of high complexity.I have made any additions or clarifications directly to the above note.This critical care time does not reflect procedure time, or teaching time or supervisory time of PA/NP/Med Resident etc but could involve care discussion time.  I spent 30  minutes of neurocritical care time  in the care of  this patient.     Delia Heady, MD Medical Director [facility] Pager: [phone] 10/02/2016 1:54 PM   To contact Stroke Continuity provider, please refer to [email or web address]. After hours, contact General Neurology

## 2016-10-02 NOTE — Progress Notes (Signed)
Initial Nutrition Assessment  INTERVENTION:   Once Cortrak tube placed recommend: Osmolite 1.2 @ 65 ml/hr (1560 ml/day) 30 ml Prostat BID Provides: 2072 kcal, 116 grams protein, and 1265 ml free water.    NUTRITION DIAGNOSIS:   Inadequate oral intake related to inability to eat as evidenced by NPO status.  GOAL:   Patient will meet greater than or equal to 90% of their needs  MONITOR:   TF tolerance, Diet advancement  REASON FOR ASSESSMENT:   Consult Enteral/tube feeding initiation and management  ASSESSMENT:   51 y.o. male with PMH of HTN, right basal ganglia infarct and alcohol abuse (4-18 beers daily). Admitted with aphasia and right-sided weakness with large left thalamic ICH with midline shift. IVC placed 10/1.    Pt discussed during ICU rounds and with RN.  Pt passed swallow eval and on Dysphagia 1 with pudding thick liquids but is lethargic and not eating. Cortrak consult placed, team not available today.   Medications reviewed and include: folic acid, MVI, senokot-s, thiamine  NS @ 50 ml/hr Off cleviprex Labs reviewed: Na 155 (H)  CBG's: [phone]  Nutrition-Focused physical exam completed. Findings are no fat depletion, no muscle depletion, and no edema. Pt did not participate in the exam. Pt did not open eyes.     Diet Order:  Diet NPO time specified  Skin:  Reviewed, no issues  Last BM:  9/26 large  Height:   Ht Readings from Last 1 Encounters:  09/24/16  (1.702 m)    Weight:   Wt Readings from Last 1 Encounters:  09/24/16 207 lb 10.8 oz (94.2 kg)    Ideal Body Weight:  67.2 kg  BMI:  Body mass index is 32.53 kg/m.  Estimated Nutritional Needs:   Kcal:  2000-2200  Protein:  110-120 grams  Fluid:  > 2 L/day  EDUCATION NEEDS:   No education needs identified at this time  Kendell Bane RD, LDN, CNSC [phone] Pager [phone] After Hours Pager

## 2016-10-02 NOTE — Progress Notes (Signed)
SLP Cancellation Note  Patient Details Name: [name] MRN: [id number] DOB: 1965/12/06   Cancelled treatment:       Reason Eval/Treat Not Completed: Medical issues which prohibited therapy;Fatigue/lethargy limiting ability to participate. Pt now NPO, cortrak pending.   [name] 10/02/2016, 10:46 AM

## 2016-10-02 NOTE — Progress Notes (Signed)
Inpatient Rehabilitation  Continuing to follow along for timing of medical readiness and ability to participate in therapy prior to initiation of insurance authorization.  Call if questions.   Charlane Ferretti., CCC/SLP Admission Coordinator  [facility]  Cell [phone]

## 2016-10-02 NOTE — Progress Notes (Signed)
PT Cancellation Note  Patient Details Name: [name] MRN: [id number] DOB: May 14, 1965   Cancelled Treatment:    Reason Eval/Treat Not Completed: Other (comment) Neuro changes.  Recommended holding.  Marland KitchenNeomia Dear, SPT [phone] office   Kamora Vossler 10/02/2016, 4:26 PM

## 2016-10-03 ENCOUNTER — Inpatient Hospital Stay (HOSPITAL_COMMUNITY): Payer: BLUE CROSS/BLUE SHIELD

## 2016-10-03 LAB — GLUCOSE, CAPILLARY
GLUCOSE-CAPILLARY: 137 mg/dL — AB (ref 65–99)
GLUCOSE-CAPILLARY: 135 mg/dL — AB (ref 65–99)
Glucose-Capillary: 187 mg/dL — ABNORMAL HIGH (ref 65–99)
Glucose-Capillary: 148 mg/dL — ABNORMAL HIGH (ref 65–99)
Glucose-Capillary: 134 mg/dL — ABNORMAL HIGH (ref 65–99)
Glucose-Capillary: 126 mg/dL — ABNORMAL HIGH (ref 65–99)

## 2016-10-03 LAB — CBC
HCT: 48.5 % (ref 39.0–52.0)
Hemoglobin: 15.5 g/dL (ref 13.0–17.0)
MCH: 33.5 pg (ref 26.0–34.0)
MCHC: 32 g/dL (ref 30.0–36.0)
MCV: 104.8 fL — ABNORMAL HIGH (ref 78.0–100.0)
PLATELETS: 124 10*3/uL — AB (ref 150–400)
RBC: 4.63 MIL/uL (ref 4.22–5.81)
RDW: 12.1 % (ref 11.5–15.5)
WBC: 12 10*3/uL — ABNORMAL HIGH (ref 4.0–10.5)

## 2016-10-03 LAB — PHOSPHORUS
PHOSPHORUS: 2.1 mg/dL — AB (ref 2.5–4.6)
Phosphorus: 4 mg/dL (ref 2.5–4.6)

## 2016-10-03 LAB — BASIC METABOLIC PANEL
ANION GAP: 7 (ref 5–15)
BUN: 35 mg/dL — AB (ref 6–20)
CALCIUM: 8.7 mg/dL — AB (ref 8.9–10.3)
CO2: 27 mmol/L (ref 22–32)
Chloride: 120 mmol/L — ABNORMAL HIGH (ref 101–111)
Creatinine, Ser: 1.16 mg/dL (ref 0.61–1.24)
GFR calc Af Amer: >60 mL/min (ref >60)
GLUCOSE: 130 mg/dL — AB (ref 65–99)
POTASSIUM: 3.8 mmol/L (ref 3.5–5.1)
Sodium: 154 mmol/L — ABNORMAL HIGH (ref 135–145)

## 2016-10-03 LAB — SODIUM
SODIUM: 157 mmol/L — AB (ref 135–145)
SODIUM: 157 mmol/L — AB (ref 135–145)
Sodium: 160 mmol/L — ABNORMAL HIGH (ref 135–145)

## 2016-10-03 LAB — MAGNESIUM
MAGNESIUM: 2.6 mg/dL — AB (ref 1.7–2.4)
Magnesium: 2.5 mg/dL — ABNORMAL HIGH (ref 1.7–2.4)

## 2016-10-03 NOTE — Progress Notes (Signed)
Cortrak Tube Team Note:  Consult received to place a Cortrak feeding tube.   A 10 F Cortrak tube was placed in the right nare and secured with a nasal bridle at 90 cm. Per the Cortrak monitor reading the tube tip is post pyloric.   X-ray is required, abdominal x-ray has been ordered by the Cortrak team. Please confirm tube placement before using the Cortrak tube.   If the tube becomes dislodged please keep the tube and contact the Cortrak team at [email or web address] ([email or web address]) for replacement.  If after hours and replacement cannot be delayed, place a NG tube and confirm placement with an abdominal x-ray.    Vanessa Kick RD, LDN Clinical Nutrition Pager # [phone]

## 2016-10-03 NOTE — Progress Notes (Signed)
SLP Cancellation Note  Patient Details Name: [name] MRN: [id number] DOB: Feb 09, 1965   Cancelled treatment:       Reason Eval/Treat Not Completed: Medical issues which prohibited therapy;Fatigue/lethargy limiting ability to participate   Carolan Shiver 10/03/2016, 3:35 PM

## 2016-10-03 NOTE — Progress Notes (Signed)
STROKE TEAM PROGRESS NOTE  HISTORY OF PRESENT ILLNESS [name] is an 51 y.o. male with PMH of HTN,alcohol abuse who presents as a stroke alert for aphasia and right-sided weakness. He was last seen normal around 7:30 PM when his girlfriend/spouse heard a loud thud. Patient was found on the floor and not speaking. EMS was called immediately and found the patient to be plegic on the right side, aphasic and had a left gaze deviation Blood pressure was 230 systolic at the scene. On arrival to [facility] ER, the patient is no longer following commands. A stat CT head was obtained which demonstrated a large left thalamic hemorrhage. The patient has a history of hypertension but not on any blood pressure medications. He is not on aspirin or any blood thinners.   SUBJECTIVE (INTERVAL HISTORY) [name] remains somnolent since last 3 days Repeat head CT 10/01/16 showed enlargement of ICH and surrounding cytotoxic edema. He was restarted on 3% saline to a sodium goal of 155-160. Neurosurgery was consulted and placed ventriculostomy for trapped ventricles without significantly high ICP noted or subsequent change in status. Blood pressure was also moderately hypotensive to 80s/60s transiently. He remains somnolent despite normalization of blood pressure and ventriculostomy drainage. Panda tube   placed y`day  ACE inhibitor therapy was not prescribed due to .No Family at bedside. EEG y`day gen slowing and no seizure activity  OBJECTIVE Temp:  [97.5 F (36.4 C)-99.9 F (37.7 C)] 99.6 F (37.6 C) (10/03 1200) Pulse Rate:  [67-85] 80 (10/03 1400) Cardiac Rhythm: Normal sinus rhythm (10/03 1400) Resp:  [0-26] 22 (10/03 1400) BP: (98-147)/(74-104) 112/76 (10/03 1400) SpO2:  [88 %-98 %] 94 % (10/03 1400) Weight:  [186 lb 11.7 oz (84.7 kg)] 186 lb 11.7 oz (84.7 kg) (10/03 0500)  CBC:   Recent Labs Lab 10/02/16 1113 10/03/16 0821  WBC 12.6* 12.0*  HGB 15.8 15.5  HCT 49.9 48.5  MCV 104.4* 104.8*   PLT 133* 124*    Basic Metabolic Panel:   Recent Labs Lab 10/02/16 1113 10/02/16 1837  10/03/16 0211 10/03/16 0821  NA 157*  155*  --   < > 154* 157*  K 3.6  --   --  3.8  --   CL 118*  --   --  120*  --   CO2 31  --   --  27  --   GLUCOSE 124*  --   --  130*  --   BUN 32*  --   --  35*  --   CREATININE 1.20  --   --  1.16  --   CALCIUM 8.8*  --   --  8.7*  --   MG 2.5* 2.5*  --  2.5*  --   PHOS 4.4 3.7  --  4.0  --   < > = values in this interval not displayed.  Lipid Panel:     Component Value Date/Time   CHOL 210 (H) 09/26/2016 0407   TRIG 153 (H) 09/26/2016 0407   HDL 55 09/26/2016 0407   CHOLHDL 3.8 09/26/2016 0407   VLDL 31 09/26/2016 0407   LDLCALC 124 (H) 09/26/2016 0407   HgbA1c:  Lab Results  Component Value Date   HGBA1C 5.8 (H) 09/26/2016   Urine Drug Screen:     Component Value Date/Time   LABOPIA NONE DETECTED 09/24/2016 2127   COCAINSCRNUR NONE DETECTED 09/24/2016 2127   LABBENZ NONE DETECTED 09/24/2016 2127   AMPHETMU NONE DETECTED 09/24/2016 2127  THCU NONE DETECTED 09/24/2016 2127   LABBARB NONE DETECTED 09/24/2016 2127    Alcohol Level     Component Value Date/Time   ETH <5 09/24/2016 2351    IMAGING I have personally reviewed the radiological images below and agree with the radiology interpretations.  Ct Angio Head W Or Wo Contrast Ct Head Code Stroke Wo Contrast  09/24/2016 IMPRESSION: CT HEAD: 1. Large LEFT thalamus and surrounding structure intraparenchymal hematoma. 3 mm LEFT-to-RIGHT midline shift without ventricular entrapment or hydrocephalus. 2. Old RIGHT basal ganglia lacunar infarct. CTA HEAD: 1. Angiographic spot sign central within LEFT hematoma consistent with active hemorrhage. 2. No emergent large vessel occlusion. 3. Severe stenosis LEFT P2 segment most compatible with atherosclerosis. Moderate stenosis anterior circulation, likely due to atherosclerosis though nonspecific in the presence of hemorrhage.   CT Head  Code Stroke 09/24/2016 IMPRESSION: 3.6 x 5.9 x 4.6 cm (volume = 51 cm^3) dense LEFT thalamus, Basal ganglia and external capsule hematoma. 3 mm LEFT-to-RIGHT midline shift. No ventricular entrapment or hydrocephalus. No acute large vascular territory infarct. Old RIGHT basal ganglia lacunar infarct. No abnormal extra-axial fluid collections. Basal cisterns are patent. VASCULAR: Moderate calcific atherosclerosis carotid siphons.  CT Head 09/24/2016 IMPRESSION: 1. Increased size of the intraparenchymal hematoma centered in the left thalamus and basal ganglia, which now has a calculated volume of 59 cc, previously 51 cc. This measurement may underestimate the degree of increase, as there are new irregular extensions arising from the margins of the primary hematoma. No new, remote hemorrhagic foci. 2. Unchanged 4 mm rightward midline shift with mass effect on the ventricles. Basal cisterns remain patent. 3. No intraventricular hemorrhagic extension or hydrocephalus. No ventricular trapping.   CT Head 09/25/2016 1. Enlarging 78 cc hematoma epicenter LEFT deep gray nuclei, increased from 59 cc. New satellite 4 mm hemorrhage. 2. Worsening 7 mm LEFT-to-RIGHT midline shift. No ventricular entrapment. 3. Old RIGHT basal ganglia lacunar infarct.  TTE 09/25/2016 Study Conclusions - Left ventricle: The cavity size was normal. Systolic function was normal. The estimated ejection fraction was in the range of 55% to 60%. Wall motion was normal; there were no regional wall motion abnormalities. Doppler parameters are consistent with abnormal left ventricular relaxation (grade 1 diastolic dysfunction). - Mitral valve: Calcified annulus. Impressions: - Normal LV systolic function; mild diastolic dysfunction.  CT head 09/28/16 Large hematoma left frontal parietal operculum unchanged. Increasedlow-density edema surrounding the hematoma. 9 mm midline shift to the right unchanged.  CT head wo  contrast 10/01/16 IMPRESSION: 1. Lateral ventriculomegaly related to trapping of the lateral ventricles since 09/24/2016. Consider ventricular decompression. 2. Stable since 09/28/2016 size and configuration of the heterogeneous and lobulated intra-axial hemorrhage centered at the left deep gray matter nuclei. Intra-axial blood volume estimated at 80-90 mL. 3. Stable intracranial mass effect including rightward midline shift of 10 mm. No intraventricular or extra-axial extension of Hemorrhage.  PHYSICAL EXAM  Temp:  [97.5 F (36.4 C)-99.9 F (37.7 C)] 99.6 F (37.6 C) (10/03 1200) Pulse Rate:  [67-85] 80 (10/03 1400) Resp:  [0-26] 22 (10/03 1400) BP: (98-147)/(74-104) 112/76 (10/03 1400) SpO2:  [88 %-98 %] 94 % (10/03 1400) Weight:  [186 lb 11.7 oz (84.7 kg)] 186 lb 11.7 oz (84.7 kg) (10/03 0500)   Vitals:   10/03/16 1136 10/03/16 1200 10/03/16 1300 10/03/16 1400  BP:  105/76 106/74 112/76  Pulse:  69 73 80  Resp:  (!) 0 20 (!) 22  Temp:  99.6 F (37.6 C)    TempSrc:  Axillary  SpO2: 95% 96% 95% 94%  Weight:      Height:         General - Well nourished, well developed, somnolent and yawning, globally aphasic  Ophthalmologic - Fundi not visualized due to noncooperation.  Cardiovascular - Regular rate and rhythm.  Neuro - stuporose, minimal participation on exam, expressive and receptive aphasia, no speech output, no longer following any significant commands. Right facial droop, tongue midline. Right hemiplegic 0/5, left upper and lower extremity spontaneous movement against gravity. Right Babinski positive. Sensation, coordination, and gait not tested due to mental status.    ASSESSMENT/PLAN [name] is a 51 y.o. male with history of hypertension and alcohol abuse presenting with aphasia and right-sided weakness.  He did not receive IV t-PA due to ICH.   ICH: Large L thalamic ICH, secondary to untreated hypertensive crisis. Delayed increasing cytotoxic edema and  mass effect and worsening neuro exam  Resultant  left gaze, right neglect, expressive aphasia, right hemiplegia  CT head:  3.6 x 5.9 x 4.6 cm (volume = 51 cm^3) dense LEFT thalamus, basal ganglia, and external capsule hematoma with 3 mm LEFT-to-RIGHT midline shift.  Repeat CT x 2 on 09/25/16 gradual enlargement of hematoma to 78cc with increased midline shift  CT repeat 09/26/16 hematoma 81cc with increased midline shift  2D Echo: EF 55-60%. No source of embolus  LDL 124  HgbA1c 5.8  SCDs for VTE prophylaxis Diet NPO time specified  No antithrombotic prior to admission, now on No antithrombotic  Ongoing aggressive stroke risk factor management  Therapy recommendations:  PT/OT consider CIR  Disposition:  Pending  Cerebral edema  Repeat CT showed gradual enlargement of hematoma with increased midline shift  CT repeat 09/28/16 stable hematoma and slight increase of edema and midline shift  CT repeat 10/01/16 stable hematoma midline shift with increased lateral ventricular trapping  On 3% saline goal 155-160. Serum sodium at goal 157 today  Mental status seems worse from weekend  ICP not highly elevated, MAP goal > 80 for adequate CPP  Hypertensive emergency  BP high on admission  SBP goal < 180  Holding antihypertensives today for transient lows yesterday - novasc and lisinopril, clonidine (MAP goal >80)  Alcohol abuse  4-18 beers daily  On CIWA protocol  Not on precedex since last week  B1/FA/MVI  Hyperlipidemia  Home meds: none  LDL 124, goal < 70  No need to initiate statin at acute ICH phase.   Dysphagia   pass swallow  On dys 1 and pudding thick liquid  Currently NPO 2/2 mental status  Other Stroke Risk Factors  Obesity, Body mass index is 29.25 kg/m., recommend weight loss, diet and exercise as appropriate   Fever  Leukocytosis trending up to 12.6, isolated 100.9F overnight. No strong focal evidence of infection up till now. . If fever  occurs again can pursue cultures, DG chest, empiric abtx.  Other Active Problems  Hyperglycemia, currently normal  Hospital day # 9 Patient's condition remains unchanged without any significant improvement despite ventriculostomy, induced hypernatremia. Continue close monitoring. Repeat CT scan of the head tomorrow and if no change will discontinue ventriculostomy catheter drainage. Check CBC and labs in the morning I have personally examined this patient, reviewed notes, independently viewed imaging studies, participated in medical decision making and plan of care.ROS completed by me personally and pertinent positives fully documented  I have made any additions or clarifications directly to the above note.   Recommend continue strict control of blood  pressure. Panda tube for tube feeding and medications. Check EEG for nonconvulsive seizures. Family not available at the bedside for discussion. This patient is critically ill and at significant risk of neurological worsening, death and care requires constant monitoring of vital signs, hemodynamics,respiratory and cardiac monitoring, extensive review of multiple databases, frequent neurological assessment, discussion with family, other specialists and medical decision making of high complexity.I have made any additions or clarifications directly to the above note.This critical care time does not reflect procedure time, or teaching time or supervisory time of PA/NP/Med Resident etc but could involve care discussion time.  I spent 32 minutes of neurocritical care time  in the care of  this patient.     Delia Heady, MD Medical Director [facility] Pager: [phone] 10/03/2016 2:54 PM   To contact Stroke Continuity provider, please refer to [email or web address]. After hours, contact General Neurology

## 2016-10-03 NOTE — Progress Notes (Signed)
Patient ID: [name], male   DOB: 1965/05/22, 51 y.o.   MRN: [id number] BP 116/77   Pulse 78   Temp 98.5 F (36.9 C) (Axillary)   Resp (!) 22   Ht  (1.702 m)   Wt 84.7 kg (186 lb 11.7 oz)   SpO2 94%   BMI 29.25 kg/m  Somnolent Drain working No neurological change

## 2016-10-03 NOTE — Progress Notes (Addendum)
Physical Therapy Treatment/Re-evaluation Patient Details Name: [name]: [id number] DOB: 19-Sep-1965 Today's Date: 10/03/2016    History of Present Illness  50 y.o. male with PMH of HTN, and and alcohol abuse.   Admitted with aphasia and right-sided weakness with left thalamic hemorrhage with midline shift s/p ventricular catheter on 10/01/16.  Pt also has a cortrack feeding tube.     PT Comments    Pt with significant decline since last therapy session (now s/p ventric drain and neuro changes).  Goals and frequency updated.  If pt starts to progress again will revisit d/c recs and plan.  Pt today with some mild active spontaneous movement of left hand, has some trace movement in right foot to noxious stimuli, and movement of left side to noxious stimuli of his right hand.  ROM WNL on right arm and leg with decrease in tone noted throughout.  Mildly tight DF ROM on left leg (all other ROM WNL), no spontaneous movement noted in left leg.  Left arm ROM intact with generally normal tone in left side.  Pt not following commands or even opening eyes to sitting up EOB.  O2 sats decrease to 88% on RA at rest, so 2 L O2 [place] used throughout session.  All other VSS.  SNF is likely d/c dispo at this point until pt shows further ability to respond/progress.    Follow Up Recommendations  SNF;Other (comment) (may update back to CIR if pt shows progress)     Equipment Recommendations  Wheelchair (measurements PT);Wheelchair cushion (measurements PT);Hospital bed;Other (comment) (hoyer lift)    Recommendations for Other Services   NA     Precautions / Restrictions Precautions Precautions: Fall;Other (comment) Precaution Comments: right side weaker than left, R ventric drain    Mobility  Bed Mobility Overal bed mobility: Needs Assistance Bed Mobility: Supine to Sit;Sit to Supine     Supine to sit: Total assist;HOB elevated Sit to supine: Total assist   General bed mobility comments: Pt not  assisting with transitions at all   Transfers                 General transfer comment: NT as pt was total assist an unarousable EOB.       Modified Rankin (Stroke Patients Only) Modified Rankin (Stroke Patients Only) Pre-Morbid Rankin Score: No symptoms Modified Rankin: Severe disability     Balance Overall balance assessment: Needs assistance Sitting-balance support: Feet unsupported;Single extremity supported Sitting balance-Leahy Scale: Zero Sitting balance - Comments: Pt seemingly proped on left hand initially pushing posteriorly, but then stopped with no ability to follow commands on this side or even open eyes EOB. Total assist to sit                                    Cognition Arousal/Alertness: Lethargic Behavior During Therapy: Flat affect Overall Cognitive Status: Difficult to assess Area of Impairment: Attention;Following commands                   Current Attention Level:  (none)   Following Commands:  (no command following)     Problem Solving:  (no processing) General Comments: Pt did not respond to EOB sitting, not arousable, withdraws to pain on left side, moves (trace on right to pain in foot, and moves left to pain at right hand).  I was unable to get pt to open his eyes.  VSS throughout  and ventric drain clamped by RN.  I was unable to remove O2 [place], because when I did he desated to 88% on RA before mobility      Exercises General Exercises - Upper Extremity Shoulder Extension: PROM;Both;10 reps;Supine Elbow Flexion: PROM;Both;10 reps;Supine Elbow Extension: PROM;Both;10 reps;Supine General Exercises - Lower Extremity Ankle Circles/Pumps: PROM;Both;10 reps;Supine Hip ABduction/ADduction: PROM;Both;10 reps;Supine Hip Flexion/Marching: PROM;Both;10 reps;Supine        Pertinent Vitals/Pain Pain Assessment: Faces Faces Pain Scale: No hurt           PT Goals (current goals can now be found in the care plan section)  Acute Rehab PT Goals Patient Stated Goal: none stated, not arousable even with EOB PT Goal Formulation: Patient unable to participate in goal setting Time For Goal Achievement: 10/17/16 Potential to Achieve Goals: Fair Progress towards PT goals: Goals downgraded-see care plan (re-eval complete, goals updated)    Frequency    Min 3X/week (may increase after pt shows progress)      PT Plan Other (comment);Frequency needs to be updated (goals updated)       AM-PAC PT "6 Clicks" Daily Activity  Outcome Measure  Difficulty turning over in bed (including adjusting bedclothes, sheets and blankets)?: Unable Difficulty moving from lying on back to sitting on the side of the bed? : Unable Difficulty sitting down on and standing up from a chair with arms (e.g., wheelchair, bedside commode, etc,.)?: Unable Help needed moving to and from a bed to chair (including a wheelchair)?: Total Help needed walking in hospital room?: Total Help needed climbing 3-5 steps with a railing? : Total 6 Click Score: 6    End of Session Equipment Utilized During Treatment: Oxygen Activity Tolerance: Patient limited by lethargy Patient left: in bed Nurse Communication: Mobility status PT Visit Diagnosis: Hemiplegia and hemiparesis;Other symptoms and signs involving the nervous system (R29.898);Other abnormalities of gait and mobility (R26.89) Hemiplegia - Right/Left: Right Hemiplegia - dominant/non-dominant: Dominant Hemiplegia - caused by: Other Nontraumatic intracranial hemorrhage     Time: 1610-9604 PT Time Calculation (min) (ACUTE ONLY): 25 min  Charges:  $Therapeutic Activity: 8-22 mins 1 re-eval     Rontrell Moquin B. Orla Jolliff, PT, DPT [phone]           10/03/2016, 12:10 PM

## 2016-10-03 NOTE — Progress Notes (Signed)
Inpatient Rehabilitation  Note PT's change in recommendations for SNF level post acute therapies.  Plan to follow at a distance for therapy needs closer to the timing of medical readiness.  Please call with questions.   Charlane Ferretti., CCC/SLP Admission Coordinator  [facility] Inpatient Rehabilitation  Cell [phone]

## 2016-10-03 NOTE — Progress Notes (Signed)
Morning sodium 154. Goal 155-160 per Dr Pearlean Brownie. 3% hypertonic saline restarted at 86ml/hr per current order.

## 2016-10-04 ENCOUNTER — Inpatient Hospital Stay (HOSPITAL_COMMUNITY): Payer: BLUE CROSS/BLUE SHIELD

## 2016-10-04 LAB — CBC
HCT: 45.2 % (ref 39.0–52.0)
HEMOGLOBIN: 14.3 g/dL (ref 13.0–17.0)
MCH: 33.6 pg (ref 26.0–34.0)
MCHC: 31.6 g/dL (ref 30.0–36.0)
MCV: 106.4 fL — ABNORMAL HIGH (ref 78.0–100.0)
PLATELETS: 127 10*3/uL — AB (ref 150–400)
RBC: 4.25 MIL/uL (ref 4.22–5.81)
RDW: 12.2 % (ref 11.5–15.5)
WBC: 10 10*3/uL (ref 4.0–10.5)

## 2016-10-04 LAB — MAGNESIUM
MAGNESIUM: 2.6 mg/dL — AB (ref 1.7–2.4)
MAGNESIUM: 2.3 mg/dL (ref 1.7–2.4)

## 2016-10-04 LAB — GLUCOSE, CAPILLARY
GLUCOSE-CAPILLARY: 149 mg/dL — AB (ref 65–99)
GLUCOSE-CAPILLARY: 133 mg/dL — AB (ref 65–99)
GLUCOSE-CAPILLARY: 148 mg/dL — AB (ref 65–99)
Glucose-Capillary: 161 mg/dL — ABNORMAL HIGH (ref 65–99)
Glucose-Capillary: 175 mg/dL — ABNORMAL HIGH (ref 65–99)
Glucose-Capillary: 198 mg/dL — ABNORMAL HIGH (ref 65–99)

## 2016-10-04 LAB — BASIC METABOLIC PANEL
BUN: 33 mg/dL — AB (ref 6–20)
CO2: 26 mmol/L (ref 22–32)
CREATININE: 1.12 mg/dL (ref 0.61–1.24)
Calcium: 8.3 mg/dL — ABNORMAL LOW (ref 8.9–10.3)
GFR calc Af Amer: >60 mL/min (ref >60)
GFR calc non Af Amer: >60 mL/min (ref >60)
GLUCOSE: 191 mg/dL — AB (ref 65–99)
POTASSIUM: 3.6 mmol/L (ref 3.5–5.1)
SODIUM: 164 mmol/L — AB (ref 135–145)

## 2016-10-04 LAB — SODIUM
SODIUM: 161 mmol/L — AB (ref 135–145)
Sodium: 160 mmol/L — ABNORMAL HIGH (ref 135–145)
Sodium: 162 mmol/L (ref 135–145)

## 2016-10-04 LAB — PHOSPHORUS
PHOSPHORUS: 2.7 mg/dL (ref 2.5–4.6)
Phosphorus: 2.5 mg/dL (ref 2.5–4.6)

## 2016-10-04 MED ORDER — FREE WATER
125.0000 mL | Freq: Once | Status: AC
  Administered 2016-10-04: 125 mL

## 2016-10-04 MED ORDER — PIPERACILLIN-TAZOBACTAM 3.375 G IVPB
3.3750 g | Freq: Three times a day (TID) | INTRAVENOUS | Status: DC
  Administered 2016-10-04 – 2016-10-06 (×7): 3.375 g via INTRAVENOUS
  Filled 2016-10-04 (×9): qty 50

## 2016-10-04 NOTE — Progress Notes (Signed)
CRITICAL VALUE ALERT  Critical Value: Na+ 162  Date & Time Notied:  10/04/2016 2000  Provider Notified: Dr. Willeen Niece  Orders Received/Actions taken: 125 ml free water per tube, once

## 2016-10-04 NOTE — Progress Notes (Signed)
Patient ID: [name], male   DOB: Jul 22, 1965, 51 y.o.   MRN: [id number] BP 131/85   Pulse 82   Temp 99.3 F (37.4 C) (Oral)   Resp (!) 21   Ht  (1.702 m)   Wt 85.1 kg (187 lb 9.8 oz)   SpO2 96%   BMI 29.38 kg/m  No changes not responsive Do not believe meaningful outcome can be achieved at this time.  Ventricular catheter remains in place

## 2016-10-04 NOTE — Progress Notes (Signed)
MD at bedside for family meeting. Discussed goals of care. Family decision to make patient a DNR.

## 2016-10-04 NOTE — Progress Notes (Signed)
CRITICAL VALUE ALERT  Critical Value:  Na 164 and Chloride >130  Date & Time Notied:  10/04/16 0400  Provider Notified: Dr Amada Jupiter  Orders Received/Actions taken: Hypertonic saline stopped

## 2016-10-04 NOTE — Progress Notes (Signed)
Inpatient Rehabilitation  Briefly met with family at the bedside to discuss change in therapy recommendations and notified that IP Rehab will follow at a distance at this time.  Call if questions.   Carmelia Roller., CCC/SLP Admission Coordinator  [facility]  Cell [phone]

## 2016-10-04 NOTE — Progress Notes (Signed)
SLP Cancellation Note  Patient Details Name: [name] MRN: [id number] DOB: 08/21/1965   Cancelled treatment:       Reason Eval/Treat Not Completed: Medical issues which prohibited therapy;Fatigue/lethargy limiting ability to participate  Ferdinand Lango MA, CCC-SLP [phone]  Ferdinand Lango Meryl 10/04/2016, 9:47 AM

## 2016-10-04 NOTE — Progress Notes (Signed)
Occupational Therapy Treatment Patient Details Name: [name] MRN: [id number] DOB: Mar 04, 1965 Today's Date: 10/04/2016    History of present illness  51 y.o. male with PMH of HTN, and and alcohol abuse.   Admitted with aphasia and right-sided weakness with left thalamic hemorrhage with midline shift s/p ventricular catheter on 10/01/16.  Pt also has a cortrack feeding tube.    OT comments  This 51 yo male admitted and underwent above presents to acute OT with decrease in medical status since last seen--goals updated this date to reflect changes. Pt with spontaneous movement of left side, but not on command; no sitting balance and no righting reaction as well as decreased alertness and eye opening. He did regard his girlfriend visually x1 today and puckered up x2 to give her a kiss when she asked him to give her a kiss. He will continue to benefit from acute OT with now recommended follow up being SNF.  Follow Up Recommendations  SNF;Supervision/Assistance - 24 hour;Other (comment) (pt with decline, SNF more appropriate at this time)    Equipment Recommendations  Other (comment) (TBD at next venue)       Precautions / Restrictions Precautions Precautions: Fall Precaution Comments: right side weaker than left, R ventric drain Restrictions Weight Bearing Restrictions: No       Mobility Bed Mobility Overal bed mobility: Needs Assistance Bed Mobility: Supine to Sit;Sit to Supine     Supine to sit: Total assist;+2 for physical assistance Sit to supine: Total assist;+2 for physical assistance   General bed mobility comments: Pt not assisting with transitions at all      Balance Overall balance assessment: Needs assistance Sitting-balance support: Feet supported;No upper extremity supported Sitting balance-Leahy Scale: Zero Sitting balance - Comments: Arms on bed, but no attempt to use to help sit; a couple of episodes of trunk extension but otherwise no movement at all. Head in  downward posture                                   ADL either performed or assessed with clinical judgement   ADL Overall ADL's : Needs assistance/impaired                                       General ADL Comments: total A      Vision   Additional Comments: Pt with eyes mainly to right and not opening eyes fully until we sat him on EOB. Then he did move his eyes to midline only past midline to left when his girlfriend Passenger transport manager) spoke to him--he did this only once.          Cognition Arousal/Alertness: Lethargic Behavior During Therapy: Flat affect Overall Cognitive Status: Difficult to assess Area of Impairment: Following commands                       Following Commands:  (when girlfriend said give me a kiss he did pucker up twice)       General Comments: The only difference with EOB sitting was increased eye opening initially. Withdraws to pain on LUE and RUE (with increased time).                    Pertinent Vitals/ Pain       Pain Assessment: Faces Faces Pain  Scale: No hurt         Frequency  Min 3X/week        Progress Toward Goals  OT Goals(current goals can now be found in the care plan section)  Progress towards OT goals: Not progressing toward goals - comment;Goals drowngraded-see care plan     Plan Discharge plan needs to be updated       AM-PAC PT "6 Clicks" Daily Activity     Outcome Measure   Help from another person eating meals?: Total Help from another person taking care of personal grooming?: Total Help from another person toileting, which includes using toliet, bedpan, or urinal?: Total Help from another person bathing (including washing, rinsing, drying)?: Total Help from another person to put on and taking off regular upper body clothing?: Total Help from another person to put on and taking off regular lower body clothing?: Total 6 Click Score: 6    End of Session Equipment Utilized  During Treatment:  (none)  OT Visit Diagnosis: Unsteadiness on feet (R26.81);Other abnormalities of gait and mobility (R26.89);Muscle weakness (generalized) (M62.81);Hemiplegia and hemiparesis Hemiplegia - Right/Left: Left Hemiplegia - dominant/non-dominant: Non-Dominant   Activity Tolerance Patient limited by fatigue   Patient Left in bed   Nurse Communication  (RN clamped off and turned back on ventric drain)        Time: 1914-7829 OT Time Calculation (min): 27 min  Charges: OT General Charges $OT Visit: 1 Visit OT Treatments $Therapeutic Activity: 23-37 mins  Ignacia Palma, OTR/L [phone] 10/04/2016

## 2016-10-04 NOTE — Progress Notes (Signed)
STROKE TEAM PROGRESS NOTE  HISTORY OF PRESENT ILLNESS William William is an 51 y.o. male with PMH of HTN,alcohol abuse who presents as a stroke alert for aphasia and right-sided weakness. He was last seen normal around 7:30 PM when his girlfriend/spouse heard a loud thud. Patient was found on the floor and not speaking. EMS was called immediately and found the patient to be plegic on the right side, aphasic and had a left gaze deviation Blood pressure was 230 systolic at the scene. On arrival to [facility] ER, the patient is no longer following commands. A stat CT head was obtained which demonstrated a large left thalamic hemorrhage. The patient has a history of hypertension but not on any blood pressure medications. He is not on aspirin or any blood thinners.   SUBJECTIVE (INTERVAL HISTORY) William William remains somnolent    despite normalization of blood pressure and ventriculostomy drainage. He spiked temperature 101 yesterday and white count(borderline elevated. Serum sodium is elevated at 164 and 3% saline is on hold..No Family at bedside. EEG 10/02/16 gen slowing and no seizure activity. Repeat CT scan of the head today shows no significant change in ventricular size or cerebral edema with ventriculostomy in place  OBJECTIVE Temp:  [98.4 F (36.9 C)-101.1 F (38.4 C)] 99.3 F (37.4 C) (10/04 1205) Pulse Rate:  [66-89] 73 (10/04 1400) Cardiac Rhythm: Normal sinus rhythm (10/04 1400) Resp:  [17-25] 20 (10/04 1400) BP: (106-143)/(72-108) 122/84 (10/04 1400) SpO2:  [94 %-98 %] 95 % (10/04 1400) Weight:  [187 lb 9.8 oz (85.1 kg)] 187 lb 9.8 oz (85.1 kg) (10/04 0500)  CBC:   Recent Labs Lab 10/03/16 0821 10/04/16 0231  WBC 12.0* 10.0  HGB 15.5 14.3  HCT 48.5 45.2  MCV 104.8* 106.4*  PLT 124* 127*    Basic Metabolic Panel:   Recent Labs Lab 10/03/16 0211  10/03/16 1652  10/04/16 0231 10/04/16 0911  NA 154*  < >  --   < > 164* 161*  K 3.8  --   --   --  3.6  --   CL 120*  --    --   --  >130*  --   CO2 27  --   --   --  26  --   GLUCOSE 130*  --   --   --  191*  --   BUN 35*  --   --   --  33*  --   CREATININE 1.16  --   --   --  1.12  --   CALCIUM 8.7*  --   --   --  8.3*  --   MG 2.5*  --  2.6*  --  2.6*  --   PHOS 4.0  --  2.1*  --  2.5  --   < > = values in this interval not displayed.  Lipid Panel:     Component Value Date/Time   CHOL 210 (H) 09/26/2016 0407   TRIG 153 (H) 09/26/2016 0407   HDL 55 09/26/2016 0407   CHOLHDL 3.8 09/26/2016 0407   VLDL 31 09/26/2016 0407   LDLCALC 124 (H) 09/26/2016 0407   HgbA1c:  Lab Results  Component Value Date   HGBA1C 5.8 (H) 09/26/2016   Urine Drug Screen:     Component Value Date/Time   LABOPIA NONE DETECTED 09/24/2016 2127   COCAINSCRNUR NONE DETECTED 09/24/2016 2127   LABBENZ NONE DETECTED 09/24/2016 2127   AMPHETMU NONE DETECTED 09/24/2016 2127  THCU NONE DETECTED 09/24/2016 2127   LABBARB NONE DETECTED 09/24/2016 2127    Alcohol Level     Component Value Date/Time   ETH <5 09/24/2016 2351    IMAGING I have personally reviewed the radiological images below and agree with the radiology interpretations.  Ct Angio Head W Or Wo Contrast Ct Head Code Stroke Wo Contrast  09/24/2016 IMPRESSION: CT HEAD: 1. Large LEFT thalamus and surrounding structure intraparenchymal hematoma. 3 mm LEFT-to-RIGHT midline shift without ventricular entrapment or hydrocephalus. 2. Old RIGHT basal ganglia lacunar infarct. CTA HEAD: 1. Angiographic spot sign central within LEFT hematoma consistent with active hemorrhage. 2. No emergent large vessel occlusion. 3. Severe stenosis LEFT P2 segment most compatible with atherosclerosis. Moderate stenosis anterior circulation, likely due to atherosclerosis though nonspecific in the presence of hemorrhage.   CT Head Code Stroke 09/24/2016 IMPRESSION: 3.6 x 5.9 x 4.6 cm (volume = 51 cm^3) dense LEFT thalamus, Basal ganglia and external capsule hematoma. 3 mm LEFT-to-RIGHT midline  shift. No ventricular entrapment or hydrocephalus. No acute large vascular territory infarct. Old RIGHT basal ganglia lacunar infarct. No abnormal extra-axial fluid collections. Basal cisterns are patent. VASCULAR: Moderate calcific atherosclerosis carotid siphons.  CT Head 09/24/2016 IMPRESSION: 1. Increased size of the intraparenchymal hematoma centered in the left thalamus and basal ganglia, which now has a calculated volume of 59 cc, previously 51 cc. This measurement may underestimate the degree of increase, as there are new irregular extensions arising from the margins of the primary hematoma. No new, remote hemorrhagic foci. 2. Unchanged 4 mm rightward midline shift with mass effect on the ventricles. Basal cisterns remain patent. 3. No intraventricular hemorrhagic extension or hydrocephalus. No ventricular trapping.   CT Head 09/25/2016 1. Enlarging 78 cc hematoma epicenter LEFT deep gray nuclei, increased from 59 cc. New satellite 4 mm hemorrhage. 2. Worsening 7 mm LEFT-to-RIGHT midline shift. No ventricular entrapment. 3. Old RIGHT basal ganglia lacunar infarct.  CT head 10/04/2016 : 1. Interval placement of RIGHT frontal ventriculostomy catheter with distal tip at foramen of Monro. Similar mild RIGHT ventricle entrapment and hydrocephalus. 2. 4.4 x 5.2 cm LEFT deep gray nuclei hemorrhage was 5 x 6.1 cm. 15 mm similar LEFT-to-RIGHT midline shift.  TTE 09/25/2016 Study Conclusions - Left ventricle: The cavity size was normal. Systolic function was normal. The estimated ejection fraction was in the range of 55% to 60%. Wall motion was normal; there were no regional wall motion abnormalities. Doppler parameters are consistent with abnormal left ventricular relaxation (grade 1 diastolic dysfunction). - Mitral valve: Calcified annulus. Impressions: - Normal LV systolic function; mild diastolic dysfunction.  CT head 09/28/16 Large hematoma left frontal parietal operculum unchanged.  Increasedlow-density edema surrounding the hematoma. 9 mm midline shift to the right unchanged.  CT head wo contrast 10/01/16 IMPRESSION: 1. Lateral ventriculomegaly related to trapping of the lateral ventricles since 09/24/2016. Consider ventricular decompression. 2. Stable since 09/28/2016 size and configuration of the heterogeneous and lobulated intra-axial hemorrhage centered at the left deep gray matter nuclei. Intra-axial blood volume estimated at 80-90 mL. 3. Stable intracranial mass effect including rightward midline shift of 10 mm. No intraventricular or extra-axial extension of Hemorrhage.  PHYSICAL EXAM  Temp:  [98.4 F (36.9 C)-101.1 F (38.4 C)] 99.3 F (37.4 C) (10/04 1205) Pulse Rate:  [66-89] 73 (10/04 1400) Resp:  [17-25] 20 (10/04 1400) BP: (106-143)/(72-108) 122/84 (10/04 1400) SpO2:  [94 %-98 %] 95 % (10/04 1400) Weight:  [187 lb 9.8 oz (85.1 kg)] 187 lb 9.8 oz (85.1 kg) (  10/04 0500)   Vitals:   10/04/16 1200 10/04/16 1205 10/04/16 1300 10/04/16 1400  BP: (!) 134/98  (!) 137/98 122/84  Pulse: 77  89 73  Resp: (!) 21  (!) 25 20  Temp:  99.3 F (37.4 C)    TempSrc:  Oral    SpO2: 97%  97% 95%  Weight:      Height:         General - Well nourished, well developed, somnolent and   globally aphasic  Ophthalmologic - Fundi not visualized due to noncooperation.  Cardiovascular - Regular rate and rhythm.  Neuro - stuporose, minimal participation on exam, expressive and receptive aphasia, no speech output, no longer following any significant commands. Right facial droop, tongue midline. Right hemiplegic 0/5, left upper and lower extremity spontaneous movement against gravity. Right Babinski positive. Sensation, coordination, and gait not tested due to mental status.    ASSESSMENT/PLAN William William is a 51 y.o. male with history of hypertension and alcohol abuse presenting with aphasia and right-sided weakness.  He did not receive IV t-PA due to ICH.   ICH:  Large L thalamic ICH, secondary to untreated hypertensive crisis. Delayed increasing cytotoxic edema and mass effect and worsening neuro exam  Resultant  left gaze, right neglect, expressive aphasia, right hemiplegia  CT head:  3.6 x 5.9 x 4.6 cm (volume = 51 cm^3) dense LEFT thalamus, basal ganglia, and external capsule hematoma with 3 mm LEFT-to-RIGHT midline shift.  Repeat CT x 2 on 09/25/16 gradual enlargement of hematoma to 78cc with increased midline shift  CT repeat 09/26/16 hematoma 81cc with increased midline shift  2D Echo: EF 55-60%. No source of embolus  LDL 124  HgbA1c 5.8  SCDs for VTE prophylaxis Diet NPO time specified  No antithrombotic prior to admission, now on No antithrombotic  Ongoing aggressive stroke risk factor management  Therapy recommendations:  PT/OT consider CIR  Disposition:  Pending  Cerebral edema  Repeat CT showed gradual enlargement of hematoma with increased midline shift  CT repeat 09/28/16 stable hematoma and slight increase of edema and midline shift  CT repeat 10/01/16 stable hematoma midline shift with increased lateral ventricular trapping  On 3% saline goal 155-160. Serum sodium at goal 157 today  Mental status seems worse from weekend  ICP not highly elevated, MAP goal > 80 for adequate CPP  Hypertensive emergency  BP high on admission  SBP goal < 180  Holding antihypertensives today for transient lows yesterday - novasc and lisinopril, clonidine (MAP goal >80)  Alcohol abuse  4-18 beers daily  On CIWA protocol  Not on precedex since last week  B1/FA/MVI  Hyperlipidemia  Home meds: none  LDL 124, goal < 70  No need to initiate statin at acute ICH phase.   Dysphagia   pass swallow  On dys 1 and pudding thick liquid  Currently NPO 2/2 mental status  Other Stroke Risk Factors  Obesity, Body mass index is 29.38 kg/m., recommend weight loss, diet and exercise as appropriate   Fever  Leukocytosis  trending up to 12.6, isolated 100.76F overnight. No strong focal evidence of infection up till now. . If fever occurs again can pursue cultures, DG chest, empiric abtx.  Other Active Problems  Hyperglycemia, currently normal  Hospital day # 10 Patient's condition remains unchanged without any significant improvement despite ventriculostomy, induced hypernatremia. Check blood and urine cultures. And chest x-ray Discontinue ventriculostomy and check CSF culture. Continue close monitoring.   I have personally examined  this patient, reviewed notes, independently viewed imaging studies, participated in medical decision making and plan of care.ROS completed by me personally and pertinent positives fully documented  I have made any additions or clarifications directly to the above note.   Recommend continue strict control of blood pressure. Panda tube for tube feeding and medications.   Family not available at the bedside for discussion. This patient is critically ill and at significant risk of neurological worsening, death and care requires constant monitoring of vital signs, hemodynamics,respiratory and cardiac monitoring, extensive review of multiple databases, frequent neurological assessment, discussion with family, other specialists and medical decision making of high complexity.I have made any additions or clarifications directly to the above note.This critical care time does not reflect procedure time, or teaching time or supervisory time of PA/NP/Med Resident etc but could involve care discussion time.  I spent 30 minutes of neurocritical care time  in the care of  this patient.     Delia Heady, MD Medical Director [facility] Pager: [phone] 10/04/2016 2:46 PM   To contact Stroke Continuity provider, please refer to [email or web address]. After hours, contact General Neurology

## 2016-10-05 ENCOUNTER — Encounter (HOSPITAL_COMMUNITY): Payer: Self-pay | Admitting: *Deleted

## 2016-10-05 LAB — SODIUM
SODIUM: 156 mmol/L — AB (ref 135–145)
SODIUM: 159 mmol/L — AB (ref 135–145)
Sodium: 159 mmol/L — ABNORMAL HIGH (ref 135–145)
Sodium: 159 mmol/L — ABNORMAL HIGH (ref 135–145)

## 2016-10-05 LAB — CSF CELL COUNT WITH DIFFERENTIAL
RBC Count, CSF: 1780 /mm3 — ABNORMAL HIGH
WBC, CSF: 3 /mm3 (ref 0–5)

## 2016-10-05 LAB — URINALYSIS, ROUTINE W REFLEX MICROSCOPIC
BACTERIA UA: NONE SEEN
Glucose, UA: >=500 mg/dL — AB
HGB URINE DIPSTICK: NEGATIVE
KETONES UR: NEGATIVE mg/dL
LEUKOCYTES UA: NEGATIVE
Nitrite: NEGATIVE
PH: 5 (ref 5.0–8.0)
Protein, ur: NEGATIVE mg/dL
SQUAMOUS EPITHELIAL / LPF: NONE SEEN
Specific Gravity, Urine: 1.039 — ABNORMAL HIGH (ref 1.005–1.030)

## 2016-10-05 LAB — GLUCOSE, CAPILLARY
GLUCOSE-CAPILLARY: 195 mg/dL — AB (ref 65–99)
GLUCOSE-CAPILLARY: 174 mg/dL — AB (ref 65–99)
GLUCOSE-CAPILLARY: 137 mg/dL — AB (ref 65–99)
GLUCOSE-CAPILLARY: 179 mg/dL — AB (ref 65–99)
Glucose-Capillary: 182 mg/dL — ABNORMAL HIGH (ref 65–99)

## 2016-10-05 LAB — MAGNESIUM: MAGNESIUM: 2.3 mg/dL (ref 1.7–2.4)

## 2016-10-05 LAB — PHOSPHORUS: PHOSPHORUS: 4.1 mg/dL (ref 2.5–4.6)

## 2016-10-05 NOTE — Progress Notes (Signed)
Patient ID: ADILSON GRAFTON, male   DOB: October 12, 1965, 51 y.o.   MRN: [id number] BP 128/86   Pulse 81   Temp (!) 102.9 F (39.4 C) (Axillary)   Resp (!) 22   Ht  (1.702 m)   Wt 84.9 kg (187 lb 2.7 oz)   SpO2 97%   BMI 29.32 kg/m  Catheter removed, csf sent for cx. No neurologic change.  Signing off.

## 2016-10-05 NOTE — Progress Notes (Signed)
STROKE TEAM PROGRESS NOTE  HISTORY OF PRESENT ILLNESS [name] is an 51 y.o. male with PMH of HTN,alcohol abuse who presents as a stroke alert for aphasia and right-sided weakness. He was last seen normal around 7:30 PM when his girlfriend/spouse heard a loud thud. Patient was found on the floor and not speaking. EMS was called immediately and found the patient to be plegic on the right side, aphasic and had a left gaze deviation Blood pressure was 010 systolic at the scene. On arrival to [facility] ER, the patient is no longer following commands. A stat CT head was obtained which demonstrated a large left thalamic hemorrhage. The patient has a history of hypertension but not on any blood pressure medications. He is not on aspirin or any blood thinners.   SUBJECTIVE (INTERVAL HISTORY) [name] remains somnolent    despite normalization of blood pressure and ventriculostomy drainage. He spiked temperature 102 yesterday and white count(borderline elevated. Serum sodium is elevated at 159 and 3% saline is on hold..No Family at bedside. I met with son and daughter last pm and explained poor prognosis and they agreed to DNR and want a few days to decide on PEG versus comfort care  OBJECTIVE Temp:  [99.3 F (37.4 C)-102 F (38.9 C)] 99.6 F (37.6 C) (10/05 0800) Pulse Rate:  [70-95] 74 (10/05 0900) Cardiac Rhythm: Normal sinus rhythm (10/05 0800) Resp:  [17-26] 23 (10/05 0900) BP: (122-149)/(82-100) 137/96 (10/05 0900) SpO2:  [95 %-98 %] 95 % (10/05 0900) Weight:  [187 lb 2.7 oz (84.9 kg)] 187 lb 2.7 oz (84.9 kg) (10/05 0400)  CBC:   Recent Labs Lab 10/03/16 0821 10/04/16 0231  WBC 12.0* 10.0  HGB 15.5 14.3  HCT 48.5 45.2  MCV 104.8* 106.4*  PLT 124* 127*    Basic Metabolic Panel:   Recent Labs Lab 10/03/16 0211  10/04/16 0231  10/04/16 1859 10/05/16 0329 10/05/16 0516 10/05/16 0755  NA 154*  < > 164*  < > 162* 156*  --  159*  K 3.8  --  3.6  --   --   --   --   --    CL 120*  --  >130*  --   --   --   --   --   CO2 27  --  26  --   --   --   --   --   GLUCOSE 130*  --  191*  --   --   --   --   --   BUN 35*  --  33*  --   --   --   --   --   CREATININE 1.16  --  1.12  --   --   --   --   --   CALCIUM 8.7*  --  8.3*  --   --   --   --   --   MG 2.5*  < > 2.6*  --  2.3  --  2.3  --   PHOS 4.0  < > 2.5  --  2.7  --  4.1  --   < > = values in this interval not displayed.  Lipid Panel:     Component Value Date/Time   CHOL 210 (H) 09/26/2016 0407   TRIG 153 (H) 09/26/2016 0407   HDL 55 09/26/2016 0407   CHOLHDL 3.8 09/26/2016 0407   VLDL 31 09/26/2016 0407   LDLCALC 124 (H) 09/26/2016 0407  HgbA1c:  Lab Results  Component Value Date   HGBA1C 5.8 (H) 09/26/2016   Urine Drug Screen:     Component Value Date/Time   LABOPIA NONE DETECTED 09/24/2016 2127   COCAINSCRNUR NONE DETECTED 09/24/2016 2127   LABBENZ NONE DETECTED 09/24/2016 2127   AMPHETMU NONE DETECTED 09/24/2016 2127   THCU NONE DETECTED 09/24/2016 2127   LABBARB NONE DETECTED 09/24/2016 2127    Alcohol Level     Component Value Date/Time   ETH <5 09/24/2016 2351    IMAGING I have personally reviewed the radiological images below and agree with the radiology interpretations.  Ct Angio Head W Or Wo Contrast Ct Head Code Stroke Wo Contrast  09/24/2016 IMPRESSION: CT HEAD: 1. Large LEFT thalamus and surrounding structure intraparenchymal hematoma. 3 mm LEFT-to-RIGHT midline shift without ventricular entrapment or hydrocephalus. 2. Old RIGHT basal ganglia lacunar infarct. CTA HEAD: 1. Angiographic spot sign central within LEFT hematoma consistent with active hemorrhage. 2. No emergent large vessel occlusion. 3. Severe stenosis LEFT P2 segment most compatible with atherosclerosis. Moderate stenosis anterior circulation, likely due to atherosclerosis though nonspecific in the presence of hemorrhage.   CT Head Code Stroke 09/24/2016 IMPRESSION: 3.6 x 5.9 x 4.6 cm (volume = 51 cm^3)  dense LEFT thalamus, Basal ganglia and external capsule hematoma. 3 mm LEFT-to-RIGHT midline shift. No ventricular entrapment or hydrocephalus. No acute large vascular territory infarct. Old RIGHT basal ganglia lacunar infarct. No abnormal extra-axial fluid collections. Basal cisterns are patent. VASCULAR: Moderate calcific atherosclerosis carotid siphons.  CT Head 09/24/2016 IMPRESSION: 1. Increased size of the intraparenchymal hematoma centered in the left thalamus and basal ganglia, which now has a calculated volume of 59 cc, previously 51 cc. This measurement may underestimate the degree of increase, as there are new irregular extensions arising from the margins of the primary hematoma. No new, remote hemorrhagic foci. 2. Unchanged 4 mm rightward midline shift with mass effect on the ventricles. Basal cisterns remain patent. 3. No intraventricular hemorrhagic extension or hydrocephalus. No ventricular trapping.   CT Head 09/25/2016 1. Enlarging 78 cc hematoma epicenter LEFT deep gray nuclei, increased from 59 cc. New satellite 4 mm hemorrhage. 2. Worsening 7 mm LEFT-to-RIGHT midline shift. No ventricular entrapment. 3. Old RIGHT basal ganglia lacunar infarct.  CT head 10/04/2016 : 1. Interval placement of RIGHT frontal ventriculostomy catheter with distal tip at foramen of Monro. Similar mild RIGHT ventricle entrapment and hydrocephalus. 2. 4.4 x 5.2 cm LEFT deep gray nuclei hemorrhage was 5 x 6.1 cm. 15 mm similar LEFT-to-RIGHT midline shift.  TTE 09/25/2016 Study Conclusions - Left ventricle: The cavity size was normal. Systolic function was normal. The estimated ejection fraction was in the range of 55% to 60%. Wall motion was normal; there were no regional wall motion abnormalities. Doppler parameters are consistent with abnormal left ventricular relaxation (grade 1 diastolic dysfunction). - Mitral valve: Calcified annulus. Impressions: - Normal LV systolic function; mild diastolic  dysfunction.  CT head 09/28/16 Large hematoma left frontal parietal operculum unchanged. Increasedlow-density edema surrounding the hematoma. 9 mm midline shift to the right unchanged.  CT head wo contrast 10/01/16 IMPRESSION: 1. Lateral ventriculomegaly related to trapping of the lateral ventricles since 09/24/2016. Consider ventricular decompression. 2. Stable since 09/28/2016 size and configuration of the heterogeneous and lobulated intra-axial hemorrhage centered at the left deep gray matter nuclei. Intra-axial blood volume estimated at 80-90 mL. 3. Stable intracranial mass effect including rightward midline shift of 10 mm. No intraventricular or extra-axial extension of Hemorrhage.  PHYSICAL EXAM  Temp:  [99.3 F (37.4 C)-102 F (38.9 C)] 99.6 F (37.6 C) (10/05 0800) Pulse Rate:  [70-95] 74 (10/05 0900) Resp:  [17-26] 23 (10/05 0900) BP: (122-149)/(82-100) 137/96 (10/05 0900) SpO2:  [95 %-98 %] 95 % (10/05 0900) Weight:  [187 lb 2.7 oz (84.9 kg)] 187 lb 2.7 oz (84.9 kg) (10/05 0400)   Vitals:   10/05/16 0600 10/05/16 0700 10/05/16 0800 10/05/16 0900  BP: (!) 149/95 (!) 146/93 (!) 127/91 (!) 137/96  Pulse: 87 85 73 74  Resp: (!) 22 (!) 25 (!) 21 (!) 23  Temp:   99.6 F (37.6 C)   TempSrc:   Oral   SpO2: 97% 98% 95% 95%  Weight:      Height:         General - Well nourished, well developed, somnolent and   globally aphasic  Ophthalmologic - Fundi not visualized due to noncooperation.  Cardiovascular - Regular rate and rhythm.  Neuro - stuporose, barely opens eyes on sternal rubexam, expressive and receptive aphasia, no speech output, no longer following any significant commands. Right facial droop, tongue midline. Right hemiplegic 0/5, left upper and lower extremity spontaneous movement against gravity. Right Babinski positive. Sensation, coordination, and gait not tested due to mental status.    ASSESSMENT/PLAN [name] is a 51 y.o. male with history of  hypertension and alcohol abuse presenting with aphasia and right-sided weakness.  He did not receive IV t-PA due to [place].   ICH: Large L thalamic ICH, secondary to untreated hypertensive crisis. Delayed increasing cytotoxic edema and mass effect and worsening neuro exam  Resultant  left gaze, right neglect, expressive aphasia, right hemiplegia  CT head:  3.6 x 5.9 x 4.6 cm (volume = 51 cm^3) dense LEFT thalamus, basal ganglia, and external capsule hematoma with 3 mm LEFT-to-RIGHT midline shift.  Repeat CT x 2 on 09/25/16 gradual enlargement of hematoma to 78cc with increased midline shift  CT repeat 09/26/16 hematoma 81cc with increased midline shift  2D Echo: EF 55-60%. No source of embolus  LDL 124  HgbA1c 5.8  SCDs for VTE prophylaxis Diet NPO time specified  No antithrombotic prior to admission, now on No antithrombotic  Ongoing aggressive stroke risk factor management  Therapy recommendations:  PT/OT consider CIR  Disposition:  Pending  Cerebral edema  Repeat CT showed gradual enlargement of hematoma with increased midline shift  CT repeat 09/28/16 stable hematoma and slight increase of edema and midline shift  CT repeat 10/01/16 stable hematoma midline shift with increased lateral ventricular trapping  On 3% saline goal 155-160. Serum sodium at goal 157 today  Mental status seems worse from weekend  ICP not highly elevated, MAP goal > 80 for adequate CPP  Hypertensive emergency  BP high on admission  SBP goal < 180  Holding antihypertensives today for transient lows yesterday - novasc and lisinopril, clonidine (MAP goal >80)  Alcohol abuse  4-18 beers daily  On CIWA protocol  Not on precedex since last week  B1/FA/MVI  Hyperlipidemia  Home meds: none  LDL 124, goal < 70  No need to initiate statin at acute ICH phase.   Dysphagia   pass swallow  On dys 1 and pudding thick liquid  Currently NPO 2/2 mental status  Other Stroke Risk  Factors  Obesity, Body mass index is 29.32 kg/m., recommend weight loss, diet and exercise as appropriate   Fever  Leukocytosis trending up to 12.6, isolated 100.10F overnight. No strong focal evidence of infection up  till now. .  On empiric abtx x 10/04/16.  Other Active Problems  Hyperglycemia, currently normal  Hospital day # 11 Patient's condition remains unchanged without any significant improvement despite ventriculostomy, induced hypernatremia. Check UA and urine cultures.   Discontinue ventriulostomy and check CSF culture. Continue close monitoring.   I have personally examined this patient, reviewed notes, independently viewed imaging studies, participated in medical decision making and plan of care.ROS completed by me personally and pertinent positives fully documented  I have made any additions or clarifications directly to the above note.   Recommend continue strict control of blood pressure. Panda tube for tube feeding and medications.   Family not available at the bedside for discussion. This patient is critically ill and at significant risk of neurological worsening, death and care requires constant monitoring of vital signs, hemodynamics,respiratory and cardiac monitoring, extensive review of multiple databases, frequent neurological assessment, discussion with family, other specialists and medical decision making of high complexity.I have made any additions or clarifications directly to the above note.This critical care time does not reflect procedure time, or teaching time or supervisory time of PA/NP/Med Resident etc but could involve care discussion time.  I spent 30 minutes of neurocritical care time  in the care of  this patient.     Antony Contras, MD Medical Director [facility] Pager: [phone] 10/05/2016 11:13 AM   To contact Stroke Continuity provider, please refer to [email or web address] After hours, contact General Neurology

## 2016-10-05 NOTE — Care Management Note (Signed)
Case Management Note  Patient Details  Name: [name] MRN: [id number] Date of Birth: March 18, 1965  Subjective/Objective:    Pt admitted on 9/24 with ICH.  PTA, pt independent of ADLS.                  Action/Plan: Will follow for discharge planning as pt progresses.    Expected Discharge Date:                  Expected Discharge Plan:     In-House Referral:     Discharge planning Services  CM Consult  Post Acute Care Choice:    Choice offered to:     DME Arranged:    DME Agency:     HH Arranged:    HH Agency:     Status of Service:  In process, will continue to follow  If discussed at Long Length of Stay Meetings, dates discussed:    Additional Comments:  10/05/16 J. Chey Cho, RN, BSN PT now recommending SNF for rehab.  Will consult CSW to facilitate dc to SNF upon medical stability.   Glennon Mac, RN 10/05/2016, 5:16 PM

## 2016-10-05 NOTE — Progress Notes (Signed)
Physical Therapy Treatment Patient Details Name: [name] MRN: [id number] DOB: Jan 16, 1965 Today's Date: 10/05/2016    History of Present Illness  51 y.o. male with PMH of HTN, and and alcohol abuse.   Admitted with aphasia and right-sided weakness with left thalamic hemorrhage with midline shift s/p ventricular catheter on 10/01/16.  Pt also has a cortrack feeding tube.     PT Comments    Pt continues to have limited response to stimuli showing reflexive movement in the right with yawns, coughing and noxious stimuli, flexion withdrawal in the left hand (none in the left leg today).  I was unable to get him to open eyes today even with EOB mobility.  Pt not physically assisting at all.  PT will continue to follow acutely to stimulate patient as he recovers to see if he will regain purposeful movement.   Follow Up Recommendations  SNF     Equipment Recommendations  Wheelchair (measurements PT);Wheelchair cushion (measurements PT);Hospital bed;Other (comment) (hoyer lift)    Recommendations for Other Services   NA     Precautions / Restrictions Precautions Precautions: Fall Precaution Comments: right side weaker than left, R ventric drain get RN to clamp drain prior to moving pt    Mobility  Bed Mobility Overal bed mobility: Needs Assistance Bed Mobility: Supine to Sit;Sit to Supine     Supine to sit: HOB elevated;Total assist Sit to supine: Total assist;HOB elevated   General bed mobility comments: Total assist to move bil legs to EOB.  HOB elevated and PT pulled trunk up to sitting. No eye opening with sitting up today, only coughing as it sounds like he is not able to manage his own secreations well.  TF was on hold and oral suction preformed.  Some reflexive movement to noxious stimuli, , yawning and coughing on the right.  Flexion withdrawl to noxious stimuli on left arm, but not left leg today.        Modified Rankin (Stroke Patients Only) Modified Rankin (Stroke  Patients Only) Pre-Morbid Rankin Score: No symptoms Modified Rankin: Severe disability     Balance Overall balance assessment: Needs assistance Sitting-balance support: Feet supported;No upper extremity supported Sitting balance-Leahy Scale: Zero Sitting balance - Comments: total assist EOB.                                     Cognition Arousal/Alertness: Lethargic Behavior During Therapy: Flat affect Overall Cognitive Status: Impaired/Different from baseline Area of Impairment: Following commands                               General Comments: PT unable to arouse with noxious stimuli, more gutteral groaning with EOB mobility.               Pertinent Vitals/Pain Pain Assessment: Faces Faces Pain Scale: No hurt           PT Goals (current goals can now be found in the care plan section) Acute Rehab PT Goals Patient Stated Goal: unable to state Progress towards PT goals: Not progressing toward goals - comment (continues to be difficult to arouse)    Frequency    Min 2X/week (decreased to 2 xs/wk until he shows further progress)      PT Plan Frequency needs to be updated       AM-PAC PT "6 Clicks" Daily Activity  Outcome Measure  Difficulty turning over in bed (including adjusting bedclothes, sheets and blankets)?: Unable Difficulty moving from lying on back to sitting on the side of the bed? : Unable Difficulty sitting down on and standing up from a chair with arms (e.g., wheelchair, bedside commode, etc,.)?: Unable Help needed moving to and from a bed to chair (including a wheelchair)?: Total Help needed walking in hospital room?: Total Help needed climbing 3-5 steps with a railing? : Total 6 Click Score: 6    End of Session Equipment Utilized During Treatment: Oxygen Activity Tolerance: Patient limited by lethargy Patient left: in bed Nurse Communication: Mobility status PT Visit Diagnosis: Hemiplegia and hemiparesis;Other  symptoms and signs involving the nervous system (R29.898);Other abnormalities of gait and mobility (R26.89) Hemiplegia - Right/Left: Right Hemiplegia - dominant/non-dominant: Dominant Hemiplegia - caused by: Other Nontraumatic intracranial hemorrhage     Time: 7829-5621 PT Time Calculation (min) (ACUTE ONLY): 19 min  Charges:  $Therapeutic Activity: 8-22 mins   Rorie Delmore B. Adonica Fukushima, PT, DPT [phone]                     10/05/2016, 11:20 AM

## 2016-10-05 NOTE — Progress Notes (Addendum)
SLP Cancellation Note  Patient Details Name: MAVERYK RENSTROM MRN: [id number] DOB: [birth year]   Cancelled treatment:       Reason Eval/Treat Not Completed: Medical issues which prohibited therapy;Fatigue/lethargy limiting ability to participate. Will sign off at this time.    Javon Hupfer, Riley Nearing 10/05/2016, 12:06 PM

## 2016-10-06 LAB — URINE CULTURE
Culture: NO GROWTH
SPECIAL REQUESTS: NORMAL

## 2016-10-06 LAB — GLUCOSE, CAPILLARY
GLUCOSE-CAPILLARY: 194 mg/dL — AB (ref 65–99)
Glucose-Capillary: 134 mg/dL — ABNORMAL HIGH (ref 65–99)
Glucose-Capillary: 189 mg/dL — ABNORMAL HIGH (ref 65–99)
Glucose-Capillary: 190 mg/dL — ABNORMAL HIGH (ref 65–99)
Glucose-Capillary: 230 mg/dL — ABNORMAL HIGH (ref 65–99)

## 2016-10-06 LAB — SODIUM: Sodium: 157 mmol/L — ABNORMAL HIGH (ref 135–145)

## 2016-10-06 MED ORDER — BISACODYL 10 MG RE SUPP: 10.0000 mg | Freq: Every day | RECTAL | Status: DC | PRN

## 2016-10-06 MED ORDER — MORPHINE SULFATE (PF) 4 MG/ML IV SOLN
1.0000 mg | INTRAVENOUS | Status: DC | PRN
  Administered 2016-10-07 (×2): 1 mg via INTRAVENOUS
  Filled 2016-10-06 (×3): qty 1

## 2016-10-06 NOTE — Progress Notes (Signed)
Pt's daughter spoke with Dr. Pearlean Brownie over phone regarding plans for hospice. She stated she would like her father [name]) to be transferred to [facility] as he is not waking up. Dr. Pearlean Brownie requested consult to SW for placement and has ordered comfort care transition for pt. Pt can transfer today if arrangements can be made. Family at bedside.

## 2016-10-06 NOTE — Progress Notes (Signed)
Patient transferred to 6N13 in bed. Unresponsive, skin warm to touch and with rapid shallow breathing. Remains DNR, plan to d/c at residential hospice.

## 2016-10-06 NOTE — Clinical Social Work Note (Signed)
CSW received consult for residential hospice, no family @ bedside, BSRN reports family wants [facility]. CSW called [facility] @ [phone] to initiate hospice referral, all referral information has been faxed to [phone], awaiting bed offer.  Following for DC planning.  Taylah Dubiel B. Gean Quint Clinical Social Work Dept Weekend Social Worker [phone] 4:05 PM

## 2016-10-06 NOTE — Progress Notes (Addendum)
STROKE TEAM PROGRESS NOTE  HISTORY OF PRESENT ILLNESS [name] is an 51 y.o. male with PMH of HTN,alcohol abuse who presents as a stroke alert for aphasia and right-sided weakness. He was last seen normal around 7:30 PM when his girlfriend/spouse heard a loud thud. Patient was found on the floor and not speaking. EMS was called immediately and found the patient to be plegic on the right side, aphasic and had a left gaze deviation Blood pressure was 865 systolic at the scene. On arrival to [facility] ER, the patient is no longer following commands. A stat CT head was obtained which demonstrated a large left thalamic hemorrhage. The patient has a history of hypertension but not on any blood pressure medications. He is not on aspirin or any blood thinners.   SUBJECTIVE (INTERVAL HISTORY) [name] remains somnolent    despite normalization of blood pressure and ventriculostomy drainage. He continues to spike temperature 102 yesterday and blood, csf and urine are also benign so far.d. Serum sodium is elevated at 156 and 3% saline is on hold..No Family at bedside. I met with son and daughter 10/04/16 and explained poor prognosis and they agreed to DNR and want a few days to decide on PEG versus comfort care. Tracheostomy catheter discontinued yesterday by neurosurgery. CSF appears to be benign with no signs of infection  OBJECTIVE Temp:  [100.9 F (38.3 C)-102.9 F (39.4 C)] 102.4 F (39.1 C) (10/06 0800) Pulse Rate:  [68-94] 80 (10/06 0800) Cardiac Rhythm: Normal sinus rhythm (10/06 0800) Resp:  [19-34] 27 (10/06 0800) BP: (95-141)/(67-92) 111/80 (10/06 0800) SpO2:  [88 %-98 %] 92 % (10/06 0800) Weight:  [188 lb 4.4 oz (85.4 kg)] 188 lb 4.4 oz (85.4 kg) (10/06 0400)  CBC:   Recent Labs Lab 10/03/16 0821 10/04/16 0231  WBC 12.0* 10.0  HGB 15.5 14.3  HCT 48.5 45.2  MCV 104.8* 106.4*  PLT 124* 127*    Basic Metabolic Panel:   Recent Labs Lab 10/03/16 0211  10/04/16 0231   10/04/16 1859  10/05/16 0516  10/05/16 2037 10/06/16 0215  NA 154*  < > 164*  < > 162*  < >  --   < > 159* 157*  K 3.8  --  3.6  --   --   --   --   --   --   --   CL 120*  --  >130*  --   --   --   --   --   --   --   CO2 27  --  26  --   --   --   --   --   --   --   GLUCOSE 130*  --  191*  --   --   --   --   --   --   --   BUN 35*  --  33*  --   --   --   --   --   --   --   CREATININE 1.16  --  1.12  --   --   --   --   --   --   --   CALCIUM 8.7*  --  8.3*  --   --   --   --   --   --   --   MG 2.5*  < > 2.6*  --  2.3  --  2.3  --   --   --  PHOS 4.0  < > 2.5  --  2.7  --  4.1  --   --   --   < > = values in this interval not displayed.  Lipid Panel:     Component Value Date/Time   CHOL 210 (H) 09/26/2016 0407   TRIG 153 (H) 09/26/2016 0407   HDL 55 09/26/2016 0407   CHOLHDL 3.8 09/26/2016 0407   VLDL 31 09/26/2016 0407   LDLCALC 124 (H) 09/26/2016 0407   HgbA1c:  Lab Results  Component Value Date   HGBA1C 5.8 (H) 09/26/2016   Urine Drug Screen:     Component Value Date/Time   LABOPIA NONE DETECTED 09/24/2016 2127   COCAINSCRNUR NONE DETECTED 09/24/2016 2127   LABBENZ NONE DETECTED 09/24/2016 2127   AMPHETMU NONE DETECTED 09/24/2016 2127   THCU NONE DETECTED 09/24/2016 2127   LABBARB NONE DETECTED 09/24/2016 2127    Alcohol Level     Component Value Date/Time   ETH <5 09/24/2016 2351    IMAGING I have personally reviewed the radiological images below and agree with the radiology interpretations.  Ct Angio Head W Or Wo Contrast Ct Head Code Stroke Wo Contrast  09/24/2016 IMPRESSION: CT HEAD: 1. Large LEFT thalamus and surrounding structure intraparenchymal hematoma. 3 mm LEFT-to-RIGHT midline shift without ventricular entrapment or hydrocephalus. 2. Old RIGHT basal ganglia lacunar infarct. CTA HEAD: 1. Angiographic spot sign central within LEFT hematoma consistent with active hemorrhage. 2. No emergent large vessel occlusion. 3. Severe stenosis LEFT P2  segment most compatible with atherosclerosis. Moderate stenosis anterior circulation, likely due to atherosclerosis though nonspecific in the presence of hemorrhage.   CT Head Code Stroke 09/24/2016 IMPRESSION: 3.6 x 5.9 x 4.6 cm (volume = 51 cm^3) dense LEFT thalamus, Basal ganglia and external capsule hematoma. 3 mm LEFT-to-RIGHT midline shift. No ventricular entrapment or hydrocephalus. No acute large vascular territory infarct. Old RIGHT basal ganglia lacunar infarct. No abnormal extra-axial fluid collections. Basal cisterns are patent. VASCULAR: Moderate calcific atherosclerosis carotid siphons.  CT Head 09/24/2016 IMPRESSION: 1. Increased size of the intraparenchymal hematoma centered in the left thalamus and basal ganglia, which now has a calculated volume of 59 cc, previously 51 cc. This measurement may underestimate the degree of increase, as there are new irregular extensions arising from the margins of the primary hematoma. No new, remote hemorrhagic foci. 2. Unchanged 4 mm rightward midline shift with mass effect on the ventricles. Basal cisterns remain patent. 3. No intraventricular hemorrhagic extension or hydrocephalus. No ventricular trapping.   CT Head 09/25/2016 1. Enlarging 78 cc hematoma epicenter LEFT deep gray nuclei, increased from 59 cc. New satellite 4 mm hemorrhage. 2. Worsening 7 mm LEFT-to-RIGHT midline shift. No ventricular entrapment. 3. Old RIGHT basal ganglia lacunar infarct.  CT head 10/04/2016 : 1. Interval placement of RIGHT frontal ventriculostomy catheter with distal tip at foramen of Monro. Similar mild RIGHT ventricle entrapment and hydrocephalus. 2. 4.4 x 5.2 cm LEFT deep gray nuclei hemorrhage was 5 x 6.1 cm. 15 mm similar LEFT-to-RIGHT midline shift.  TTE 09/25/2016 Study Conclusions - Left ventricle: The cavity size was normal. Systolic function was normal. The estimated ejection fraction was in the range of 55% to 60%. Wall motion was normal; there  were no regional wall motion abnormalities. Doppler parameters are consistent with abnormal left ventricular relaxation (grade 1 diastolic dysfunction). - Mitral valve: Calcified annulus. Impressions: - Normal LV systolic function; mild diastolic dysfunction.  CT head 09/28/16 Large hematoma left frontal parietal operculum unchanged. Increasedlow-density edema surrounding  the hematoma. 9 mm midline shift to the right unchanged.  CT head wo contrast 10/01/16 IMPRESSION: 1. Lateral ventriculomegaly related to trapping of the lateral ventricles since 09/24/2016. Consider ventricular decompression. 2. Stable since 09/28/2016 size and configuration of the heterogeneous and lobulated intra-axial hemorrhage centered at the left deep gray matter nuclei. Intra-axial blood volume estimated at 80-90 mL. 3. Stable intracranial mass effect including rightward midline shift of 10 mm. No intraventricular or extra-axial extension of Hemorrhage.  PHYSICAL EXAM  Temp:  [100.9 F (38.3 C)-102.9 F (39.4 C)] 102.4 F (39.1 C) (10/06 0800) Pulse Rate:  [68-94] 80 (10/06 0800) Resp:  [19-34] 27 (10/06 0800) BP: (95-141)/(67-92) 111/80 (10/06 0800) SpO2:  [88 %-98 %] 92 % (10/06 0800) Weight:  [188 lb 4.4 oz (85.4 kg)] 188 lb 4.4 oz (85.4 kg) (10/06 0400)   Vitals:   10/06/16 0500 10/06/16 0600 10/06/16 0700 10/06/16 0800  BP: 117/74 121/76 (!) 141/86 111/80  Pulse: 77 79 94 80  Resp: (!) 26 (!) 27 (!) 34 (!) 27  Temp:    (!) 102.4 F (39.1 C)  TempSrc:    Oral  SpO2: 90% (!) 88% 91% 92%  Weight:      Height:         General - Well nourished, well developed, somnolent and   globally aphasic  Ophthalmologic - Fundi not visualized due to noncooperation.  Cardiovascular - Regular rate and rhythm.  Neuro - stuporose, barely opens eyes on sternal rubexam, expressive and receptive aphasia, no speech output, no longer following any significant commands. Right facial droop, tongue midline. Right  hemiplegic 0/5, left upper and lower extremity spontaneous movement against gravity. Right Babinski positive. Sensation, coordination, and gait not tested due to mental status.    ASSESSMENT/PLAN [name] is a 51 y.o. male with history of hypertension and alcohol abuse presenting with aphasia and right-sided weakness.  He did not receive IV t-PA due to [place].   ICH: Large L thalamic ICH, secondary to untreated hypertensive crisis. Delayed increasing cytotoxic edema and mass effect and worsening neuro exam  Resultant  left gaze, right neglect, expressive aphasia, right hemiplegia  CT head:  3.6 x 5.9 x 4.6 cm (volume = 51 cm^3) dense LEFT thalamus, basal ganglia, and external capsule hematoma with 3 mm LEFT-to-RIGHT midline shift.  Repeat CT x 2 on 09/25/16 gradual enlargement of hematoma to 78cc with increased midline shift  CT repeat 09/26/16 hematoma 81cc with increased midline shift  2D Echo: EF 55-60%. No source of embolus  LDL 124  HgbA1c 5.8  SCDs for VTE prophylaxis Diet NPO time specified  No antithrombotic prior to admission, now on No antithrombotic  Ongoing aggressive stroke risk factor management  Therapy recommendations:  PT/OT consider CIR  Disposition:  Pending  Cerebral edema  Repeat CT showed gradual enlargement of hematoma with increased midline shift  CT repeat 09/28/16 stable hematoma and slight increase of edema and midline shift  CT repeat 10/01/16 stable hematoma midline shift with increased lateral ventricular trapping  On 3% saline goal 155-160. Serum sodium at goal 157 today  Mental status seems worse from weekend  ICP not highly elevated, MAP goal > 80 for adequate CPP  Hypertensive emergency  BP high on admission  SBP goal < 180  Holding antihypertensives today for transient lows yesterday - novasc and lisinopril, clonidine (MAP goal >80)  Alcohol abuse  4-18 beers daily  On CIWA protocol  Not on precedex since last  week  B1/FA/MVI  Hyperlipidemia  Home meds: none  LDL 124, goal < 70  No need to initiate statin at acute ICH phase.   Dysphagia   pass swallow  On dys 1 and pudding thick liquid  Currently NPO 2/2 mental status  Other Stroke Risk Factors  Obesity, Body mass index is 29.49 kg/m., recommend weight loss, diet and exercise as appropriate   Fever  Leukocytosis trending up to 12.6,temo spike 102. No strong focal evidence of infection up till now. .  On empiric Zosyn x 10/04/16.  Other Active Problems  Hyperglycemia, currently normal  Hospital day # 12 Patient's condition remains unchanged without any significant improvement despite ventriculostomy, induced hypernatremia. Check UA and urine cultures.   Discontinue ventriulostomy and check CSF culture. Continue close monitoring.   I have personally examined this patient, reviewed notes, independently viewed imaging studies, participated in medical decision making and plan of care.ROS completed by me personally and pertinent positives fully documented  I have made any additions or clarifications directly to the above note.   Recommend continue strict control of blood pressure. Panda tube for tube feeding and medications.   Family not available at the bedside for discussion. [name] is appears quite poor he may need PEG tube and prolonged nursing home stay and family to decide if this is acceptable and will make a decision soon about comfort care versus PEG tube and SNF placementThis patient is critically ill and at significant risk of neurological worsening, death and care requires constant monitoring of vital signs, hemodynamics,respiratory and cardiac monitoring, extensive review of multiple databases, frequent neurological assessment, discussion with family, other specialists and medical decision making of high complexity.I have made any additions or clarifications directly to the above note.This critical care time does not reflect  procedure time, or teaching time or supervisory time of PA/NP/Med Resident etc but could involve care discussion time.  I spent 32 minutes of neurocritical care time  in the care of  this patient.     Antony Contras, MD Medical Director [facility] Pager: [phone] 10/06/2016 10:57 AM   ADDENDUM :  I spoke to his daughter who arrived after rounds. Family has decided om comfort care measures only and would like to transfer him to [facility] to be close to home. Will DC tube feeds, and all medicines and tests except prn morphine.  Antony Contras, MD To contact Stroke Continuity provider, please refer to [email or web address] After hours, contact General Neurology

## 2016-10-06 NOTE — Progress Notes (Signed)
Updated Paul Half, Daughter, that patient is transferring to [phone]. Paul Half said she will update all other family. She told me that she has been in touch with [facility] and they are planning to all meet with the patient in the morning to evaluate him for admission into hospice.  Our contact at [facility] is Janann August (nurse) [phone]

## 2016-10-06 NOTE — Clinical Social Work Note (Signed)
CSW re-faxed  hospice referral information to [facility] @ [phone].  Following for DC planning.  Shaquille Janes B. Gean Quint Clinical Social Work Dept Weekend Social Worker [phone] 4:18 PM

## 2016-10-06 NOTE — Discharge Summary (Signed)
Stroke Discharge Summary  Patient ID: [name]   MRN: [id number]      DOB: [birth year]  Date of Admission: 09/24/2016 Date of Discharge: 10/07/2016  Attending Physician:  Micki Riley, MD, Stroke MD Consultant(s):   Treatment Team:  Coletta Memos, MD rehabilitation medicine and Neurosurgery Dr Riley Kill and Dr Franky Macho Patient's PCP:  Patient, No Pcp Per   DISCHARGE DIAGNOSIS: Large LEFT thalamus and   intraparenchymal hemorrhage with intraventricular extension or hydrocephalus.of ypertensive etiology with cytotoxic edema and brain herniation     Active Problems:   ICH (intracerebral hemorrhage) (HCC)   Encounter for central line placement   Hemorrhagic stroke (HCC)   Hydrocephalus   Cytotoxic brain edema (HCC)   Brain herniation (HCC)   Past Medical History:  Diagnosis Date  . Hypertension   . Stroke [facility])    History reviewed. No pertinent surgical history.  Allergies as of 10/07/2016   No Known Allergies     Medication List    STOP taking these medications   aspirin EC 81 MG tablet       LABORATORY STUDIES CBC    Component Value Date/Time   WBC 10.0 10/04/2016 0231   RBC 4.25 10/04/2016 0231   HGB 14.3 10/04/2016 0231   HCT 45.2 10/04/2016 0231   PLT 127 (L) 10/04/2016 0231   MCV 106.4 (H) 10/04/2016 0231   MCH 33.6 10/04/2016 0231   MCHC 31.6 10/04/2016 0231   RDW 12.2 10/04/2016 0231   LYMPHSABS 3.2 09/24/2016 2029   MONOABS 0.9 09/24/2016 2029   EOSABS 0.2 09/24/2016 2029   BASOSABS 0.0 09/24/2016 2029   CMP    Component Value Date/Time   NA 157 (H) 10/06/2016 0215   K 3.6 10/04/2016 0231   CL >130 (HH) 10/04/2016 0231   CO2 26 10/04/2016 0231   GLUCOSE 191 (H) 10/04/2016 0231   BUN 33 (H) 10/04/2016 0231   CREATININE 1.12 10/04/2016 0231   CALCIUM 8.3 (L) 10/04/2016 0231   PROT 6.5 09/24/2016 2029   ALBUMIN 3.8 09/24/2016 2029   AST 53 (H) 09/24/2016 2029   ALT 111 (H) 09/24/2016 2029   ALKPHOS 77 09/24/2016 2029   BILITOT 1.2  09/24/2016 2029   GFRNONAA >60 10/04/2016 0231   GFRAA >60 10/04/2016 0231   COAGS Lab Results  Component Value Date   INR 1.09 09/24/2016   Lipid Panel    Component Value Date/Time   CHOL 210 (H) 09/26/2016 0407   TRIG 153 (H) 09/26/2016 0407   HDL 55 09/26/2016 0407   CHOLHDL 3.8 09/26/2016 0407   VLDL 31 09/26/2016 0407   LDLCALC 124 (H) 09/26/2016 0407   HgbA1C  Lab Results  Component Value Date   HGBA1C 5.8 (H) 09/26/2016   Urinalysis    Component Value Date/Time   COLORURINE AMBER (A) 10/05/2016 0934   APPEARANCEUR CLEAR 10/05/2016 0934   LABSPEC 1.039 (H) 10/05/2016 0934   PHURINE 5.0 10/05/2016 0934   GLUCOSEU >=500 (A) 10/05/2016 0934   HGBUR NEGATIVE 10/05/2016 0934   BILIRUBINUR SMALL (A) 10/05/2016 0934   KETONESUR NEGATIVE 10/05/2016 0934   PROTEINUR NEGATIVE 10/05/2016 0934   NITRITE NEGATIVE 10/05/2016 0934   LEUKOCYTESUR NEGATIVE 10/05/2016 0934   Urine Drug Screen     Component Value Date/Time   LABOPIA NONE DETECTED 09/24/2016 2127   COCAINSCRNUR NONE DETECTED 09/24/2016 2127   LABBENZ NONE DETECTED 09/24/2016 2127   AMPHETMU NONE DETECTED 09/24/2016 2127   THCU NONE DETECTED 09/24/2016 2127  LABBARB NONE DETECTED 09/24/2016 2127    Alcohol Level    Component Value Date/Time   ETH <5 09/24/2016 2351     SIGNIFICANT DIAGNOSTIC STUDIES  Ct Angio Head W Or Wo Contrast Ct Head Code Stroke Wo Contrast  09/24/2016 IMPRESSION:   CT HEAD:  1. Large LEFT thalamus and surrounding structure intraparenchymal hematoma. 3 mm LEFT-to-RIGHT midline shift without ventricular entrapment or hydrocephalus.  2. Old RIGHT basal ganglia lacunar infarct.   CTA HEAD:  1. Angiographic spot sign central within LEFT hematoma consistent with active hemorrhage.  2. No emergent large vessel occlusion.  3. Severe stenosis LEFT P2 segment most compatible with atherosclerosis. Moderate stenosis anterior circulation, likely due to atherosclerosis though  nonspecific in the presence of hemorrhage.   CT Head Code Stroke 09/24/2016 IMPRESSION:  3.6 x 5.9 x 4.6 cm (volume = 51 cm^3) dense LEFT thalamus, Basal ganglia and external capsule hematoma. 3 mm LEFT-to-RIGHT midline shift. No ventricular entrapment or hydrocephalus. No acute large vascular territory infarct. Old RIGHT basal ganglia lacunar infarct. No abnormal extra-axial fluid collections. Basal cisterns are patent. VASCULAR: Moderate calcific atherosclerosis carotid siphons.  CT Head 09/24/2016 IMPRESSION:  1. Increased size of the intraparenchymal hematoma centered in the left thalamus and basal ganglia, which now has a calculated volume of 59 cc, previously 51 cc. This measurement may underestimate the degree of increase, as there are new irregular extensions arising from the margins of the primary hematoma. No new, remote hemorrhagic foci.  2. Unchanged 4 mm rightward midline shift with mass effect on the ventricles. Basal cisterns remain patent.  3. No intraventricular hemorrhagic extension or hydrocephalus. No ventricular trapping.   CT Head 09/25/2016 1. Enlarging 78 cc hematoma epicenter LEFT deep gray nuclei, increased from 59 cc. New satellite 4 mm hemorrhage. 2. Worsening 7 mm LEFT-to-RIGHT midline shift. No ventricular entrapment. 3. Old RIGHT basal ganglia lacunar infarct.  CT head 09/28/16 Large hematoma left frontal parietal operculum unchanged. Increasedlow-density edema surrounding the hematoma. 9 mm midline shift to the right unchanged.   CT head wo contrast 10/01/16 IMPRESSION:  1. Lateral ventriculomegaly related to trapping of the lateral ventricles since 09/24/2016. Consider ventricular decompression.  2. Stable since 09/28/2016 size and configuration of the heterogeneous and lobulated intra-axial hemorrhage centered at the left deep gray matter nuclei. Intra-axial blood volume estimated at 80-90 mL.  3. Stable intracranial mass effect including rightward  midline shift of 10 mm. No intraventricular or extra-axial extension of Hemorrhage.   CT head 10/04/2016 :  1. Interval placement of RIGHT frontal ventriculostomy catheter with distal tip at foramen of Monro. Similar mild RIGHT ventricle entrapment and hydrocephalus. 2. 4.4 x 5.2 cm LEFT deep gray nuclei hemorrhage was 5 x 6.1 cm. 15 mm similar LEFT-to-RIGHT midline shift.   TTE 09/25/2016 Study Conclusions - Left ventricle: The cavity size was normal. Systolic function wasnormal. The estimated ejection fraction was in the range of 55%to 60%. Wall motion was normal; there were no regional wall motion abnormalities. Doppler parameters are consistent withabnormal left ventricular relaxation (grade 1 diastolic dysfunction). - Mitral valve: Calcified annulus. Impressions: - Normal LV systolic function; mild diastolic dysfunction.     HISTORY OF PRESENT ILLNESS [name] is an 51 y.o. male with PMH of HTN, previous stroke and alcohol abuse who presents with aphasia and right-sided weakness. He was last seen normal around 7:30 PM when his girlfriend/spouse heard a loud thud. Patient was found on the floor and not speaking. EMS was called immediately and found  the patient to be plegic on the right side, aphasic and had a left gaze deviation Blood pressure was 230 systolic at the scene. On arrival to [facility] ER, the patient is no longer following commands. A stat CT head was obtained which demonstrated a large left thalamic hemorrhage. The patient has a history of hypertension but not on any blood pressure medications. He is not on aspirin or any blood thinners.  Date last known well: 09.24.18 Time last known well: 7.30 pm tPA Given: no, hemorrhage NIHSS 24 Modified Rankin: 0  Intracerebral Hemorrhage (ICH) Score  Glascow Coma Score  5-12 1  Age >/=no 0  ICH volume >/= 30ml  yes +1  IVH yes no 0  Infratentorial origin no 0 Total: 2   HOSPITAL COURSE [name] is a 51 y.o. male with history of hypertension, previous stroke and alcohol abuse presenting with aphasia and right-sided weakness.  He did not receive IV t-PA due to ICH. He was admitted to the neuro intensive care unit.  ICH: Large L thalamic ICH, secondary to untreated hypertensive crisis. Delayed increasing cytotoxic edema and mass effect and worsening neuro exam  Resultant  left gaze, right neglect, expressive aphasia, right hemiplegia  CT head:  3.6 x 5.9 x 4.6 cm (volume = 51 cm^3) dense LEFT thalamus, basal ganglia, and external capsule hematoma with 3 mm LEFT-to-RIGHT midline shift.  Repeat CT x 2 on 09/25/16 gradual enlargement of hematoma to 78cc with increased midline shift  CT repeat 09/26/16 hematoma 81cc with increased midline shift  2D Echo: EF 55-60%. No source of embolus  LDL 124  HgbA1c 5.8  SCDs for VTE prophylaxis  Diet NPO time specified  No antithrombotic prior to admission, now on No antithrombotic  Ongoing aggressive stroke risk factor management  Therapy recommendations:   no further therapy is been recommended.  Disposition:   discharge to hospice  Cerebral edema  Repeat CT showed gradual enlargement of hematoma with increased midline shift  CT repeat 09/28/16 stable hematoma and slight increase of edema and midline shift  CT repeat 10/01/16 stable hematoma midline shift with increased lateral ventricular trapping  3% saline discontinued for comfort care  Mental status seems worse from weekend  ICP not highly elevated, MAP goal > 80 for adequate CPP  Hypertensive emergency  BP high on admission  SBP goal < 180              Holding antihypertensives today for transient lows yesterday - novasc and lisinopril, clonidine (MAP goal >80)  Alcohol abuse  4-18 beers daily  On CIWA protocol  Not on precedex since last week  B1/FA/MVI  Hyperlipidemia  Home meds: none  LDL 124, goal < 70  No need to initiate statin at acute  ICH phase.   Dysphagia   pass swallow  On dys 1 and pudding thick liquid  Currently NPO 2/2 mental status    Other Stroke Risk Factors  Obesity, Body mass index is 29.49 kg/m., recommend weight loss, diet and exercise as appropriate   Fever  99.9 - (Tmax 102.9) -> antibiotics discontinued secondary to comfort care only.  Other Active Problems  Hyperglycemia, currently normal  Following discussions between Dr. Pearlean Brownie in the patient's daughter on 10/06/2016 the patient's daughter made an informed decision to withdraw care and make patient DO NOT RESUSCITATE and full comfort care only. Arrangements are being made to transfer the patient to a hospice center. His CODE STATUS is DO NOT RESUSCITATE and  all medications have been discontinued except for lorazepam and morphine as needed for comfort measures.   DISCHARGE EXAM Blood pressure 127/81, pulse 95, temperature 100.1 F (37.8 C), temperature source Oral, resp. rate (!) 26, height  (1.702 m), weight 188 lb 4.4 oz (85.4 kg), SpO2 (!) 88 %.  General - Well nourished, well developed, somnolent and   globally aphasic  Ophthalmologic - Fundi not visualized due to noncooperation.  Cardiovascular - Regular rate and rhythm.  Neuro - stuporose, barely opens eyes on sternal rubexam, expressive and receptive aphasia, no speech output, no longer following any significant commands. Right facial droop, tongue midline. Right hemiplegic 0/5, left upper and lower extremity spontaneous movement against gravity. Right Babinski positive. Sensation, coordination, and gait not tested due to mental status.   Discharge Diet   Diet NPO time specified liquids  DISCHARGE PLAN  Disposition:  Discharged to hospice center.  No antithrombotic for secondary stroke prevention - comfort care only.    The patient is being discharged from [facility] today to be admitted to the [facility] residential level of care. He  has a prognosis of less then 6 months. I have been in touch with the representative from [facility] and they will prescribe and supply the comfort care medications required.   40 minutes were spent preparing discharge.  Delton See PA-C [facility] Pager [phone] 10/07/2016, 12:05 PM  I have personally examined this patient, reviewed notes, independently viewed imaging studies, participated in medical decision making and plan of care.ROS completed by me personally and pertinent positives fully documented  I have made any additions or clarifications directly to the above note.   Delia Heady, MD Medical Director [facility] Stroke Center Pager: [phone] 10/07/2016 2:18 PM

## 2016-10-07 NOTE — Progress Notes (Signed)
Pt ready for transport to [facility].

## 2016-10-07 NOTE — Progress Notes (Signed)
Report given to [facility] at [facility].

## 2016-10-07 NOTE — Progress Notes (Addendum)
Clinical Social Worker facilitated patient discharge including contacting patient family and facility to confirm patient discharge plans.  Clinical information faxed to facility and family agreeable with plan.  CSW arranged ambulance transport via [facility] to [facility] .  RN to call [phone] for report prior to discharge.  Clinical Social Worker will sign off for now as social work intervention is no longer needed. Please consult [place] again if new need arises.  Marrianne Mood, MSW, [employer] [phone]

## 2016-10-07 NOTE — Progress Notes (Signed)
STROKE TEAM PROGRESS NOTE  HISTORY OF PRESENT ILLNESS [name] is an 51 y.o. male with PMH of HTN,alcohol abuse who presents as a stroke alert for aphasia and right-sided weakness. He was last seen normal around 7:30 PM when his girlfriend/spouse heard a loud thud. Patient was found on the floor and not speaking. EMS was called immediately and found the patient to be plegic on the right side, aphasic and had a left gaze deviation Blood pressure was 230 systolic at the scene. On arrival to [facility] ER, the patient is no longer following commands. A stat CT head was obtained which demonstrated a large left thalamic hemorrhage. The patient has a history of hypertension but not on any blood pressure medications. He is not on aspirin or any blood thinners.   SUBJECTIVE (INTERVAL HISTORY) [name]  was transferred to the hospice floor yesterday after family agreed to DNR and comfort care. He is on when necessary morphine and appears comfortable. Plan is to transfer him to [facility] when bed available. No family available at bedside today  OBJECTIVE Temp:  [100.1 F (37.8 C)-102.7 F (39.3 C)] 100.1 F (37.8 C) (10/07 0509) Pulse Rate:  [76-95] 95 (10/07 0509) Resp:  [26-30] 26 (10/07 0509) BP: (100-127)/(71-81) 127/81 (10/07 0509) SpO2:  [88 %-94 %] 88 % (10/07 0509)  CBC:   Recent Labs Lab 10/03/16 0821 10/04/16 0231  WBC 12.0* 10.0  HGB 15.5 14.3  HCT 48.5 45.2  MCV 104.8* 106.4*  PLT 124* 127*    Basic Metabolic Panel:   Recent Labs Lab 10/03/16 0211  10/04/16 0231  10/04/16 1859  10/05/16 0516  10/05/16 2037 10/06/16 0215  NA 154*  < > 164*  < > 162*  < >  --   < > 159* 157*  K 3.8  --  3.6  --   --   --   --   --   --   --   CL 120*  --  >130*  --   --   --   --   --   --   --   CO2 27  --  26  --   --   --   --   --   --   --   GLUCOSE 130*  --  191*  --   --   --   --   --   --   --   BUN 35*  --  33*  --   --   --   --   --   --   --   CREATININE  1.16  --  1.12  --   --   --   --   --   --   --   CALCIUM 8.7*  --  8.3*  --   --   --   --   --   --   --   MG 2.5*  < > 2.6*  --  2.3  --  2.3  --   --   --   PHOS 4.0  < > 2.5  --  2.7  --  4.1  --   --   --   < > = values in this interval not displayed.  Lipid Panel:     Component Value Date/Time   CHOL 210 (H) 09/26/2016 0407   TRIG 153 (H) 09/26/2016 0407   HDL 55 09/26/2016 0407   CHOLHDL 3.8 09/26/2016 0407  VLDL 31 09/26/2016 0407   LDLCALC 124 (H) 09/26/2016 0407   HgbA1c:  Lab Results  Component Value Date   HGBA1C 5.8 (H) 09/26/2016   Urine Drug Screen:     Component Value Date/Time   LABOPIA NONE DETECTED 09/24/2016 2127   COCAINSCRNUR NONE DETECTED 09/24/2016 2127   LABBENZ NONE DETECTED 09/24/2016 2127   AMPHETMU NONE DETECTED 09/24/2016 2127   THCU NONE DETECTED 09/24/2016 2127   LABBARB NONE DETECTED 09/24/2016 2127    Alcohol Level     Component Value Date/Time   ETH <5 09/24/2016 2351    IMAGING I have personally reviewed the radiological images below and agree with the radiology interpretations.  Ct Angio Head W Or Wo Contrast Ct Head Code Stroke Wo Contrast  09/24/2016 IMPRESSION: CT HEAD: 1. Large LEFT thalamus and surrounding structure intraparenchymal hematoma. 3 mm LEFT-to-RIGHT midline shift without ventricular entrapment or hydrocephalus. 2. Old RIGHT basal ganglia lacunar infarct. CTA HEAD: 1. Angiographic spot sign central within LEFT hematoma consistent with active hemorrhage. 2. No emergent large vessel occlusion. 3. Severe stenosis LEFT P2 segment most compatible with atherosclerosis. Moderate stenosis anterior circulation, likely due to atherosclerosis though nonspecific in the presence of hemorrhage.   CT Head Code Stroke 09/24/2016 IMPRESSION: 3.6 x 5.9 x 4.6 cm (volume = 51 cm^3) dense LEFT thalamus, Basal ganglia and external capsule hematoma. 3 mm LEFT-to-RIGHT midline shift. No ventricular entrapment or hydrocephalus. No acute  large vascular territory infarct. Old RIGHT basal ganglia lacunar infarct. No abnormal extra-axial fluid collections. Basal cisterns are patent. VASCULAR: Moderate calcific atherosclerosis carotid siphons.  CT Head 09/24/2016 IMPRESSION: 1. Increased size of the intraparenchymal hematoma centered in the left thalamus and basal ganglia, which now has a calculated volume of 59 cc, previously 51 cc. This measurement may underestimate the degree of increase, as there are new irregular extensions arising from the margins of the primary hematoma. No new, remote hemorrhagic foci. 2. Unchanged 4 mm rightward midline shift with mass effect on the ventricles. Basal cisterns remain patent. 3. No intraventricular hemorrhagic extension or hydrocephalus. No ventricular trapping.   CT Head 09/25/2016 1. Enlarging 78 cc hematoma epicenter LEFT deep gray nuclei, increased from 59 cc. New satellite 4 mm hemorrhage. 2. Worsening 7 mm LEFT-to-RIGHT midline shift. No ventricular entrapment. 3. Old RIGHT basal ganglia lacunar infarct.  CT head 10/04/2016 : 1. Interval placement of RIGHT frontal ventriculostomy catheter with distal tip at foramen of Monro. Similar mild RIGHT ventricle entrapment and hydrocephalus. 2. 4.4 x 5.2 cm LEFT deep gray nuclei hemorrhage was 5 x 6.1 cm. 15 mm similar LEFT-to-RIGHT midline shift.  TTE 09/25/2016 Study Conclusions - Left ventricle: The cavity size was normal. Systolic function was normal. The estimated ejection fraction was in the range of 55% to 60%. Wall motion was normal; there were no regional wall motion abnormalities. Doppler parameters are consistent with abnormal left ventricular relaxation (grade 1 diastolic dysfunction). - Mitral valve: Calcified annulus. Impressions: - Normal LV systolic function; mild diastolic dysfunction.  CT head 09/28/16 Large hematoma left frontal parietal operculum unchanged. Increasedlow-density edema surrounding the hematoma. 9 mm midline  shift to the right unchanged.  CT head wo contrast 10/01/16 IMPRESSION: 1. Lateral ventriculomegaly related to trapping of the lateral ventricles since 09/24/2016. Consider ventricular decompression. 2. Stable since 09/28/2016 size and configuration of the heterogeneous and lobulated intra-axial hemorrhage centered at the left deep gray matter nuclei. Intra-axial blood volume estimated at 80-90 mL. 3. Stable intracranial mass effect including rightward midline shift of  10 mm. No intraventricular or extra-axial extension of Hemorrhage.  PHYSICAL EXAM  Temp:  [100.1 F (37.8 C)-102.7 F (39.3 C)] 100.1 F (37.8 C) (10/07 0509) Pulse Rate:  [76-95] 95 (10/07 0509) Resp:  [26-30] 26 (10/07 0509) BP: (100-127)/(71-81) 127/81 (10/07 0509) SpO2:  [88 %-94 %] 88 % (10/07 0509)   Vitals:   10/06/16 1400 10/06/16 1600 10/06/16 2000 10/07/16 0509  BP: 114/73   127/81  Pulse: 80  88 95  Resp: (!) 30   (!) 26  Temp:  (!) 102.7 F (39.3 C)  100.1 F (37.8 C)  TempSrc:  Axillary  Oral  SpO2: 94%  (!) 88% (!) 88%  Weight:      Height:         General - Well nourished, well developed, Unresponsive  Ophthalmologic - Fundi not visualized due to noncooperation.  Cardiovascular - Regular rate and rhythm.  Neuro - stuporose, barely opens eyes on sternal rubexam, expressive and receptive aphasia, no speech output, no longer following any significant commands. Right facial droop, tongue midline. Right hemiplegic 0/5, left upper and lower extremity spontaneous movement against gravity. Right Babinski positive. Sensation, coordination, and gait not tested due to mental status.    ASSESSMENT/PLAN [name] is a 51 y.o. male with history of hypertension and alcohol abuse presenting with aphasia and right-sided weakness.  He did not receive IV t-PA due to ICH.   ICH: Large L thalamic ICH, secondary to untreated hypertensive crisis. Delayed increasing cytotoxic edema and mass effect and  worsening neuro exam  Resultant  left gaze, right neglect, expressive aphasia, right hemiplegia  CT head:  3.6 x 5.9 x 4.6 cm (volume = 51 cm^3) dense LEFT thalamus, basal ganglia, and external capsule hematoma with 3 mm LEFT-to-RIGHT midline shift.  Repeat CT x 2 on 09/25/16 gradual enlargement of hematoma to 78cc with increased midline shift  CT repeat 09/26/16 hematoma 81cc with increased midline shift  2D Echo: EF 55-60%. No source of embolus  LDL 124  HgbA1c 5.8  SCDs for VTE prophylaxis Diet NPO time specified  No antithrombotic prior to admission, now on No antithrombotic  Ongoing aggressive stroke risk factor management  Therapy recommendations:  PT/OT consider CIR  Disposition:  Pending  Cerebral edema  Repeat CT showed gradual enlargement of hematoma with increased midline shift  CT repeat 09/28/16 stable hematoma and slight increase of edema and midline shift  CT repeat 10/01/16 stable hematoma midline shift with increased lateral ventricular trapping  On 3% saline goal 155-160. Serum sodium at goal 157 today  Mental status seems worse from weekend  ICP not highly elevated, MAP goal > 80 for adequate CPP  Hypertensive emergency  BP high on admission  SBP goal < 180  Holding antihypertensives today for transient lows yesterday - novasc and lisinopril, clonidine (MAP goal >80)  Alcohol abuse  4-18 beers daily  On CIWA protocol  Not on precedex since last week  B1/FA/MVI  Hyperlipidemia  Home meds: none  LDL 124, goal < 70  No need to initiate statin at acute ICH phase.   Dysphagia   pass swallow  On dys 1 and pudding thick liquid  Currently NPO 2/2 mental status  Other Stroke Risk Factors  Obesity, Body mass index is 29.49 kg/m., recommend weight loss, diet and exercise as appropriate   Fever  Leukocytosis trending up to 12.6,temo spike 102. No strong focal evidence of infection up till now. .  On empiric Zosyn x 10/04/16.  Other  Active Problems  Hyperglycemia, currently normal  Hospital day # 13  Patient has been made DO NOT RESUSCITATE and comfort care upon family's request. He is getting morphine when necessary. Plan to transfer to [facility] when bed available. Patient appears medically stable for transfer to hospice nursing home bed when available. No family available at bedside for discussion at this time.   Delia Heady, MD Medical Director [facility] Stroke Center Pager: [phone] 10/07/2016 12:50 PM    To contact Stroke Continuity provider, please refer to [email or web address]. After hours, contact General Neurology

## 2016-10-07 NOTE — Progress Notes (Signed)
Texted PA for Neuro that pt has bed.

## 2016-10-07 NOTE — Progress Notes (Signed)
Pt appears comfortable at this time

## 2016-10-07 NOTE — Progress Notes (Signed)
Inpatient Rehabilitation  Note that patient is now a DNR and plans are to discharge to a residential hospice.  Will sign off at this time.  Call if questions.    Charlane Ferretti., CCC/SLP Admission Coordinator  [facility] Inpatient Rehabilitation  Cell [phone]

## 2016-10-09 LAB — CULTURE, BLOOD (ROUTINE X 2)
CULTURE: NO GROWTH
Culture: NO GROWTH

## 2016-10-09 LAB — CSF CULTURE W GRAM STAIN: Culture: NO GROWTH

## 2016-10-09 LAB — CSF CULTURE: SPECIAL REQUESTS: NORMAL

## 2016-10-11 ENCOUNTER — Telehealth: Payer: Self-pay

## 2016-10-11 NOTE — Telephone Encounter (Signed)
Rn was calling patients daughter about FMLA forms. Rn stated if the patient was still in [facility]. The daughter reported her father pass away on 10-20-16. Teresa(daughter) reported she does not need FMLA forms now.

## 2016-10-11 NOTE — Telephone Encounter (Signed)
thanks

## 2016-11-01 DEATH — deceased

## 2019-04-16 IMAGING — CT CT HEAD W/O CM
3 of 4 series · 13 of 47 positions shown, 15 images · non-contrast
Comparison: CT head 09/26/2016

CLINICAL DATA: Intracranial hemorrhage followup

EXAM:
CT HEAD WITHOUT CONTRAST
TECHNIQUE: Contiguous axial images were obtained from the base of the skull
through the vertex without intravenous contrast.

[Series 3: head without · axial · non-contrast · 0.45mm/px · z∈[-118,+2]mm · 7 of 33 slices shown, 9 images]
[im 5/33  brain]
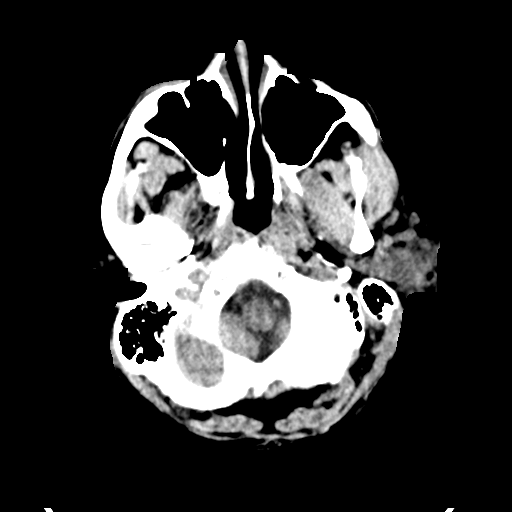
[im 5/33  bone]
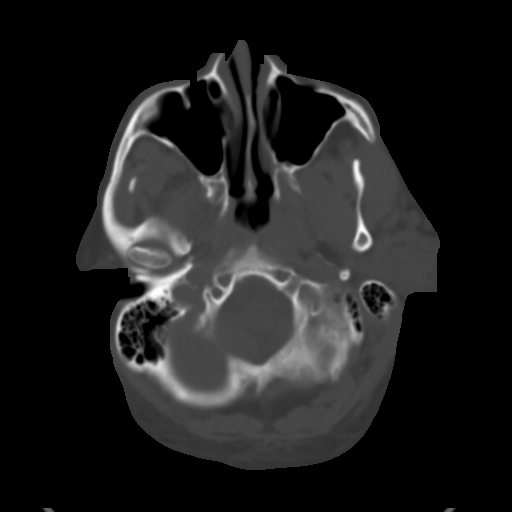
[im 9/33  brain]
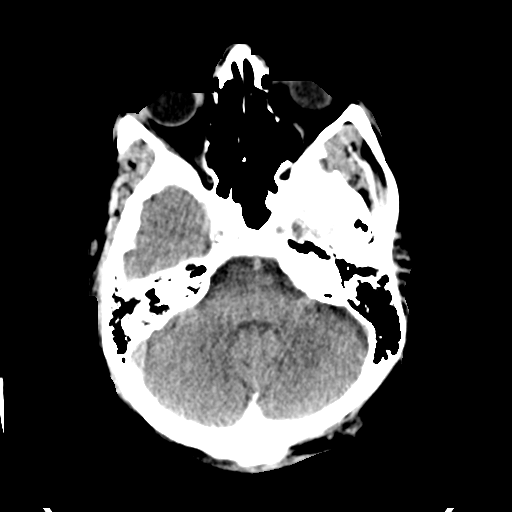
[im 13/33  brain]
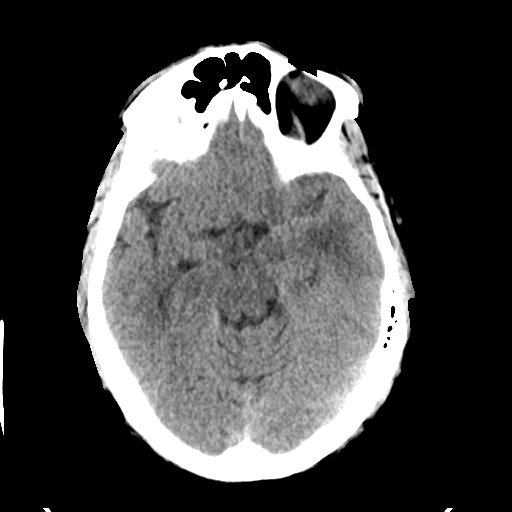
[im 17/33  brain]
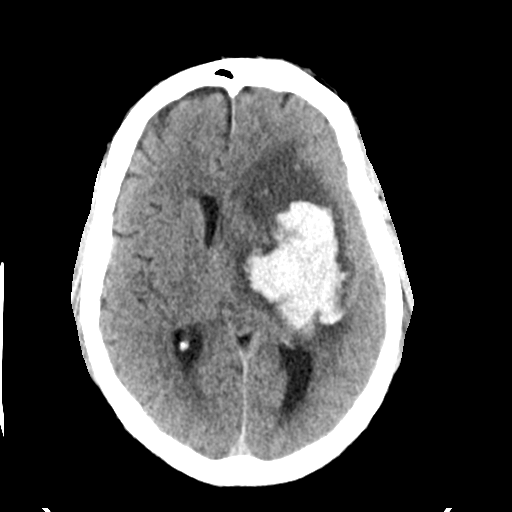
[im 21/33  brain]
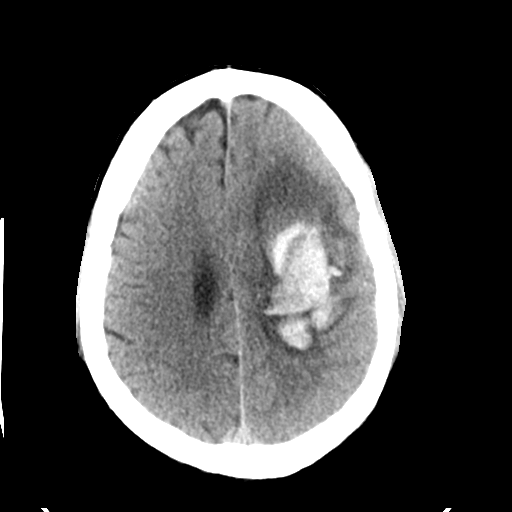
[im 21/33  bone]
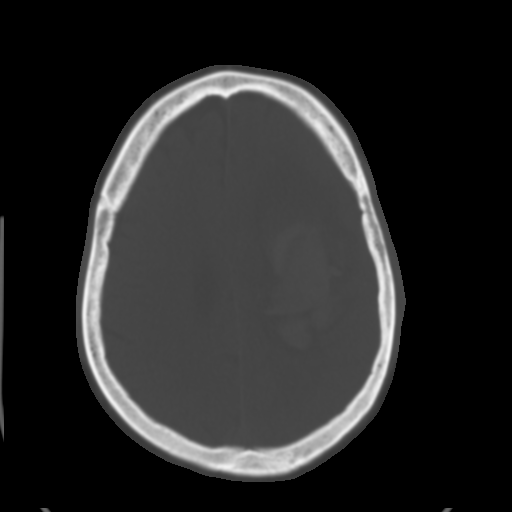
[im 25/33  brain]
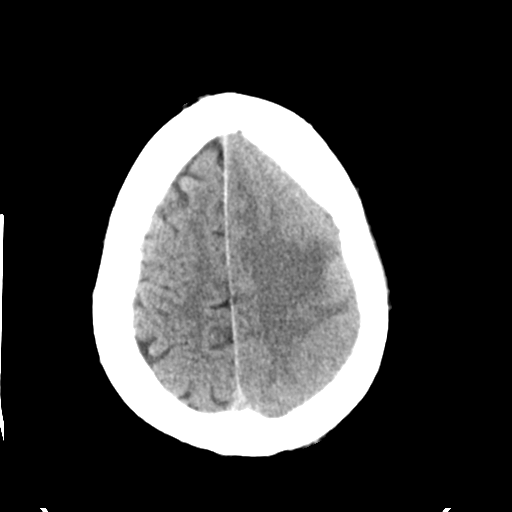
[im 29/33  brain]
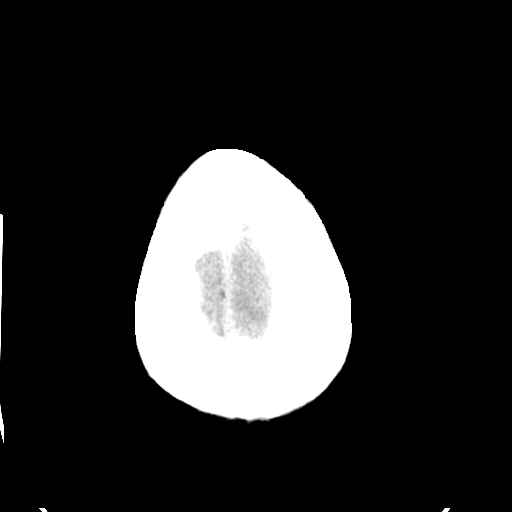

[Series 5: head without cor · coronal · non-contrast · 0.32mm/px · 3 of 76 slices shown]
[im 26/76  brain]
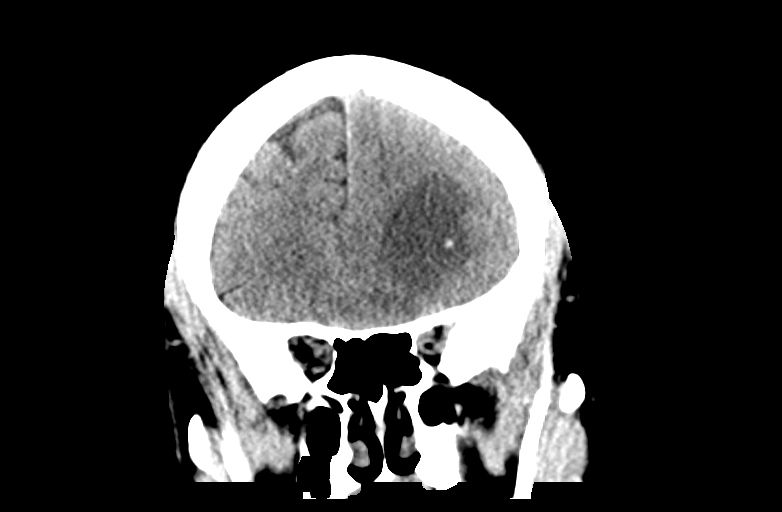
[im 34/76  brain]
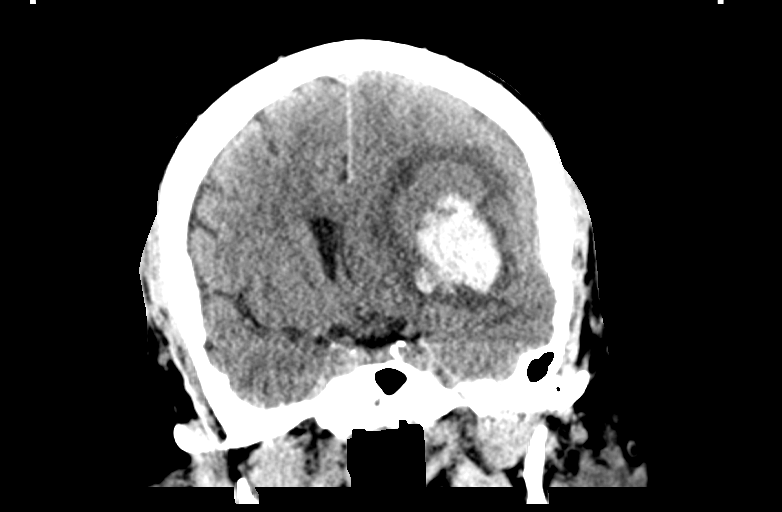
[im 42/76  brain]
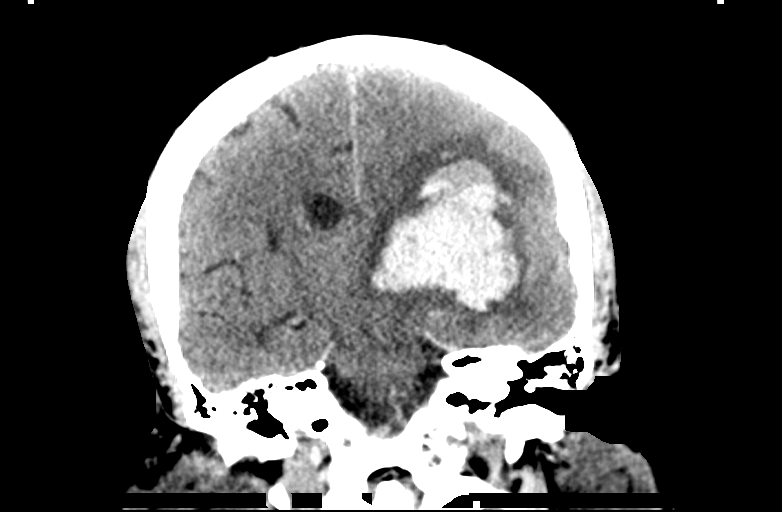

[Series 6: head without sag · sagittal · non-contrast · 0.32mm/px · 3 of 67 slices shown]
[im 23/67  brain]
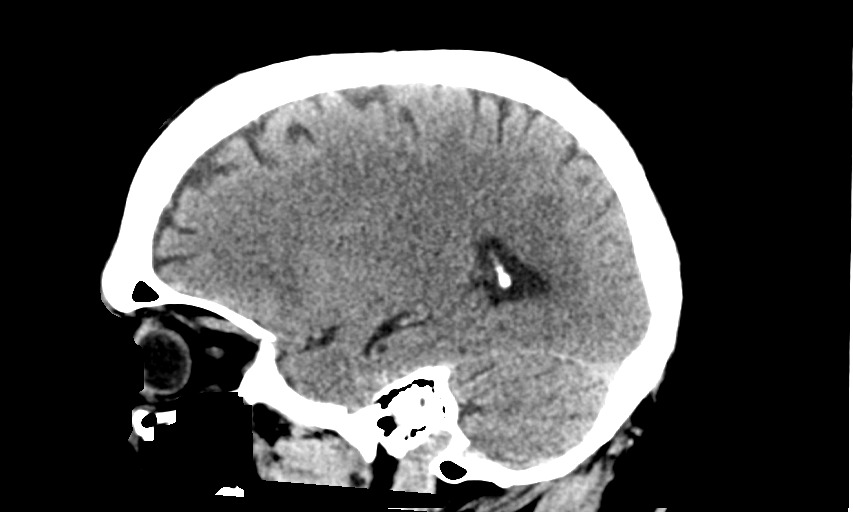
[im 34/67  brain]
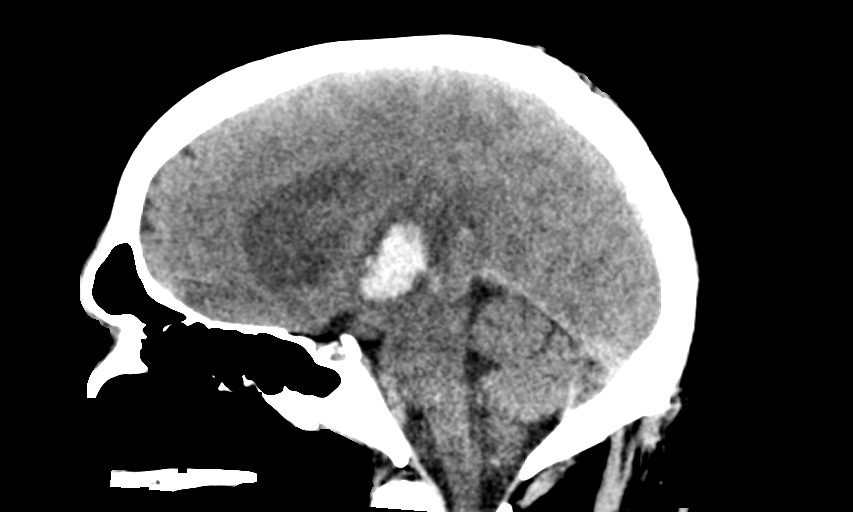
[im 45/67  brain]
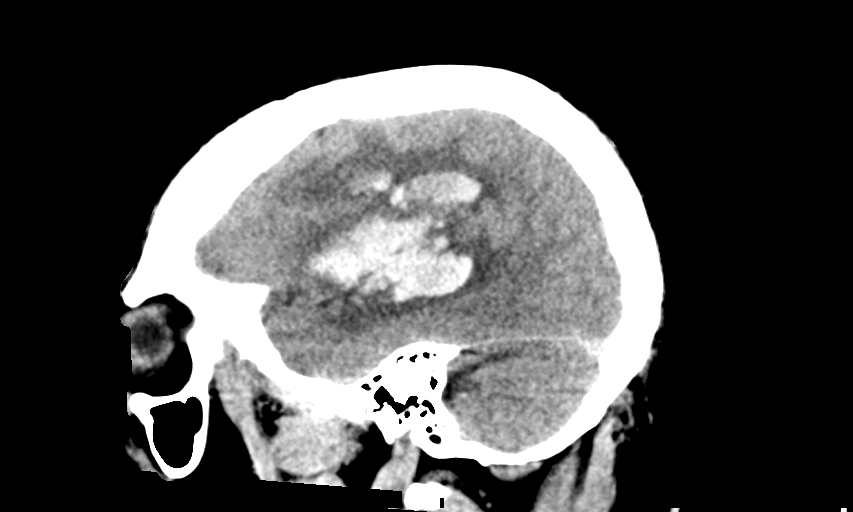

[13 of 47 positions shown; findings below may reference images not displayed]

FINDINGS: Brain: High-density hemorrhage in the left frontal parietal
operculum measures 6.4 x 4.3 cm, unchanged in size. Large amount of
surrounding low-density edema has progressed. 9 mm midline shift to
the right unchanged. Ventricles not enlarged. No intraventricular or
subarachnoid hemorrhage identified.

Vascular: Negative for hyperdense vessel

Skull: Negative

Sinuses/Orbits: Negative

Other: None
IMPRESSION: Large hematoma left frontal parietal operculum unchanged. Increased
low-density edema surrounding the hematoma. 9 mm midline shift to
the right unchanged.

## 2019-04-20 IMAGING — DX DG CHEST 1V PORT
1 series · 1 of 1 positions shown · non-contrast
Comparison: 09/25/2016

CLINICAL DATA: Aspiration abnormal breathing

EXAM:
PORTABLE CHEST 1 VIEW

[chest ap]
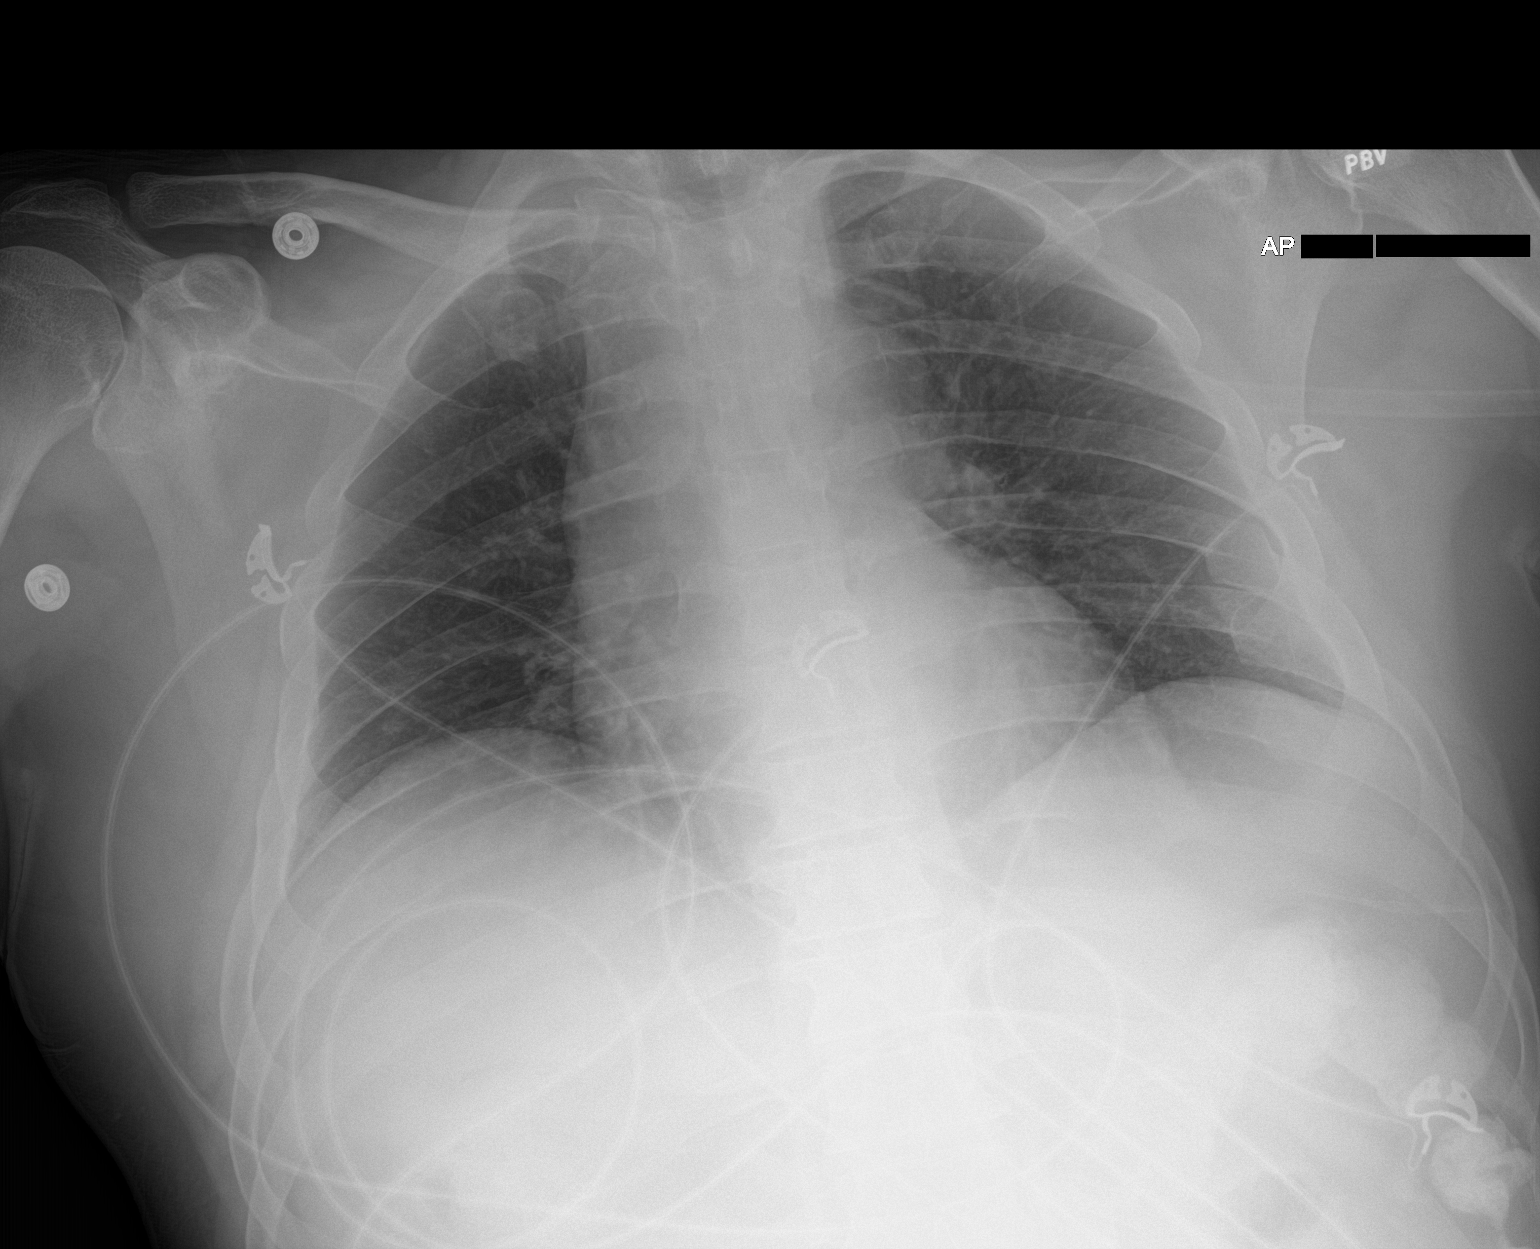

[1 of 1 positions shown; findings below may reference images not displayed]

FINDINGS: Low lung volumes. No consolidation or effusion. Removal of
right-sided catheter. Stable cardiomediastinal silhouette. No
pneumothorax. Radiodense material in the colon.
IMPRESSION: Low lung volumes.  Negative for edema or infiltrate.
